# Patient Record
Sex: Female | Born: 1940 | Race: White | Hispanic: No | Marital: Single
Health system: Southern US, Academic
[De-identification: ages and names within clinical notes are randomized; demographics above are authoritative.]

## PROBLEM LIST (undated history)

## (undated) ENCOUNTER — Encounter: Payer: MEDICARE | Attending: Internal Medicine | Primary: Internal Medicine

## (undated) ENCOUNTER — Encounter

## (undated) ENCOUNTER — Telehealth

## (undated) ENCOUNTER — Encounter: Attending: Internal Medicine | Primary: Internal Medicine

## (undated) ENCOUNTER — Encounter: Attending: Geriatric Medicine | Primary: Geriatric Medicine

## (undated) ENCOUNTER — Telehealth: Attending: Internal Medicine | Primary: Internal Medicine

## (undated) ENCOUNTER — Ambulatory Visit: Payer: MEDICARE | Attending: Internal Medicine | Primary: Internal Medicine

## (undated) ENCOUNTER — Encounter: Attending: Pharmacotherapy | Primary: Pharmacotherapy

## (undated) ENCOUNTER — Ambulatory Visit: Payer: MEDICARE

## (undated) ENCOUNTER — Ambulatory Visit: Attending: Pharmacist | Primary: Pharmacist

## (undated) ENCOUNTER — Encounter
Attending: Student in an Organized Health Care Education/Training Program | Primary: Student in an Organized Health Care Education/Training Program

## (undated) ENCOUNTER — Ambulatory Visit

## (undated) ENCOUNTER — Ambulatory Visit
Attending: Rehabilitative and Restorative Service Providers" | Primary: Rehabilitative and Restorative Service Providers"

## (undated) ENCOUNTER — Ambulatory Visit: Attending: Internal Medicine | Primary: Internal Medicine

## (undated) ENCOUNTER — Encounter: Payer: MEDICARE | Attending: Gerontology | Primary: Gerontology

## (undated) ENCOUNTER — Ambulatory Visit: Attending: Clinical | Primary: Clinical

## (undated) ENCOUNTER — Non-Acute Institutional Stay: Payer: MEDICARE | Attending: Internal Medicine | Primary: Internal Medicine

## (undated) DIAGNOSIS — Z8719 Personal history of other diseases of the digestive system: Secondary | ICD-10-CM

## (undated) DIAGNOSIS — K219 Gastro-esophageal reflux disease without esophagitis: Secondary | ICD-10-CM

## (undated) DIAGNOSIS — D649 Anemia, unspecified: Secondary | ICD-10-CM

## (undated) DIAGNOSIS — Z8639 Personal history of other endocrine, nutritional and metabolic disease: Secondary | ICD-10-CM

## (undated) DIAGNOSIS — Z800 Family history of malignant neoplasm of digestive organs: Secondary | ICD-10-CM

## (undated) DIAGNOSIS — F329 Major depressive disorder, single episode, unspecified: Secondary | ICD-10-CM

## (undated) DIAGNOSIS — F419 Anxiety disorder, unspecified: Secondary | ICD-10-CM

## (undated) HISTORY — DX: Personal history of other diseases of the digestive system: Z87.19

## (undated) HISTORY — DX: Family history of malignant neoplasm of digestive organs: Z80.0

## (undated) HISTORY — DX: Anxiety disorder, unspecified: F41.9

## (undated) HISTORY — DX: Anemia, unspecified: D64.9

## (undated) HISTORY — DX: Personal history of other endocrine, nutritional and metabolic disease: Z86.39

## (undated) HISTORY — DX: Gastro-esophageal reflux disease without esophagitis: K21.9

---
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
## 2005-10-11 ENCOUNTER — Encounter: Admission: RE | Admit: 2005-10-11 | Discharge: 2005-10-11 | Payer: Self-pay | Admitting: Gastroenterology

## 2010-12-16 ENCOUNTER — Encounter: Payer: Self-pay | Admitting: Gastroenterology

## 2012-11-03 HISTORY — PX: COLONOSCOPY: SHX174

## 2012-12-15 HISTORY — PX: COLON SURGERY: SHX602

## 2014-12-02 DIAGNOSIS — E782 Mixed hyperlipidemia: Secondary | ICD-10-CM | POA: Diagnosis not present

## 2014-12-02 DIAGNOSIS — E1144 Type 2 diabetes mellitus with diabetic amyotrophy: Secondary | ICD-10-CM | POA: Diagnosis not present

## 2014-12-02 DIAGNOSIS — I129 Hypertensive chronic kidney disease with stage 1 through stage 4 chronic kidney disease, or unspecified chronic kidney disease: Secondary | ICD-10-CM | POA: Diagnosis not present

## 2014-12-09 DIAGNOSIS — N182 Chronic kidney disease, stage 2 (mild): Secondary | ICD-10-CM | POA: Diagnosis not present

## 2014-12-09 DIAGNOSIS — Z6820 Body mass index (BMI) 20.0-20.9, adult: Secondary | ICD-10-CM | POA: Diagnosis not present

## 2014-12-09 DIAGNOSIS — E1144 Type 2 diabetes mellitus with diabetic amyotrophy: Secondary | ICD-10-CM | POA: Diagnosis not present

## 2014-12-09 DIAGNOSIS — I129 Hypertensive chronic kidney disease with stage 1 through stage 4 chronic kidney disease, or unspecified chronic kidney disease: Secondary | ICD-10-CM | POA: Diagnosis not present

## 2015-01-03 DIAGNOSIS — Z1231 Encounter for screening mammogram for malignant neoplasm of breast: Secondary | ICD-10-CM | POA: Diagnosis not present

## 2015-02-24 DIAGNOSIS — Z0000 Encounter for general adult medical examination without abnormal findings: Secondary | ICD-10-CM | POA: Diagnosis not present

## 2015-02-24 DIAGNOSIS — Z139 Encounter for screening, unspecified: Secondary | ICD-10-CM | POA: Diagnosis not present

## 2015-02-24 DIAGNOSIS — Z1389 Encounter for screening for other disorder: Secondary | ICD-10-CM | POA: Diagnosis not present

## 2015-03-31 DIAGNOSIS — E1140 Type 2 diabetes mellitus with diabetic neuropathy, unspecified: Secondary | ICD-10-CM | POA: Diagnosis not present

## 2015-04-07 DIAGNOSIS — I129 Hypertensive chronic kidney disease with stage 1 through stage 4 chronic kidney disease, or unspecified chronic kidney disease: Secondary | ICD-10-CM | POA: Diagnosis not present

## 2015-04-07 DIAGNOSIS — N182 Chronic kidney disease, stage 2 (mild): Secondary | ICD-10-CM | POA: Diagnosis not present

## 2015-04-07 DIAGNOSIS — M810 Age-related osteoporosis without current pathological fracture: Secondary | ICD-10-CM | POA: Diagnosis not present

## 2015-04-07 DIAGNOSIS — E1144 Type 2 diabetes mellitus with diabetic amyotrophy: Secondary | ICD-10-CM | POA: Diagnosis not present

## 2015-04-07 DIAGNOSIS — E782 Mixed hyperlipidemia: Secondary | ICD-10-CM | POA: Diagnosis not present

## 2015-04-14 DIAGNOSIS — M6281 Muscle weakness (generalized): Secondary | ICD-10-CM | POA: Diagnosis not present

## 2015-04-14 DIAGNOSIS — M79641 Pain in right hand: Secondary | ICD-10-CM | POA: Diagnosis not present

## 2015-04-14 DIAGNOSIS — M25641 Stiffness of right hand, not elsewhere classified: Secondary | ICD-10-CM | POA: Diagnosis not present

## 2015-04-14 DIAGNOSIS — M150 Primary generalized (osteo)arthritis: Secondary | ICD-10-CM | POA: Diagnosis not present

## 2015-07-27 DIAGNOSIS — F329 Major depressive disorder, single episode, unspecified: Secondary | ICD-10-CM | POA: Diagnosis not present

## 2015-07-28 DIAGNOSIS — R413 Other amnesia: Secondary | ICD-10-CM | POA: Diagnosis not present

## 2015-08-04 DIAGNOSIS — E782 Mixed hyperlipidemia: Secondary | ICD-10-CM | POA: Diagnosis not present

## 2015-08-04 DIAGNOSIS — E1144 Type 2 diabetes mellitus with diabetic amyotrophy: Secondary | ICD-10-CM | POA: Diagnosis not present

## 2015-08-09 DIAGNOSIS — E782 Mixed hyperlipidemia: Secondary | ICD-10-CM | POA: Diagnosis not present

## 2015-08-09 DIAGNOSIS — N182 Chronic kidney disease, stage 2 (mild): Secondary | ICD-10-CM | POA: Diagnosis not present

## 2015-08-09 DIAGNOSIS — I129 Hypertensive chronic kidney disease with stage 1 through stage 4 chronic kidney disease, or unspecified chronic kidney disease: Secondary | ICD-10-CM | POA: Diagnosis not present

## 2015-08-09 DIAGNOSIS — E1169 Type 2 diabetes mellitus with other specified complication: Secondary | ICD-10-CM | POA: Diagnosis not present

## 2015-09-27 DIAGNOSIS — H25013 Cortical age-related cataract, bilateral: Secondary | ICD-10-CM | POA: Diagnosis not present

## 2015-09-27 DIAGNOSIS — H25043 Posterior subcapsular polar age-related cataract, bilateral: Secondary | ICD-10-CM | POA: Diagnosis not present

## 2015-09-27 DIAGNOSIS — H2512 Age-related nuclear cataract, left eye: Secondary | ICD-10-CM | POA: Diagnosis not present

## 2015-09-27 DIAGNOSIS — H2513 Age-related nuclear cataract, bilateral: Secondary | ICD-10-CM | POA: Diagnosis not present

## 2015-09-27 DIAGNOSIS — H18413 Arcus senilis, bilateral: Secondary | ICD-10-CM | POA: Diagnosis not present

## 2015-11-03 DIAGNOSIS — L219 Seborrheic dermatitis, unspecified: Secondary | ICD-10-CM | POA: Diagnosis not present

## 2015-11-10 DIAGNOSIS — Z139 Encounter for screening, unspecified: Secondary | ICD-10-CM | POA: Diagnosis not present

## 2015-11-10 DIAGNOSIS — Z1389 Encounter for screening for other disorder: Secondary | ICD-10-CM | POA: Diagnosis not present

## 2015-11-10 DIAGNOSIS — E782 Mixed hyperlipidemia: Secondary | ICD-10-CM | POA: Diagnosis not present

## 2015-11-10 DIAGNOSIS — M50322 Other cervical disc degeneration at C5-C6 level: Secondary | ICD-10-CM | POA: Diagnosis not present

## 2015-11-10 DIAGNOSIS — F332 Major depressive disorder, recurrent severe without psychotic features: Secondary | ICD-10-CM | POA: Diagnosis not present

## 2015-11-10 DIAGNOSIS — E1169 Type 2 diabetes mellitus with other specified complication: Secondary | ICD-10-CM | POA: Diagnosis not present

## 2015-11-14 DIAGNOSIS — H25812 Combined forms of age-related cataract, left eye: Secondary | ICD-10-CM | POA: Diagnosis not present

## 2015-11-14 DIAGNOSIS — H2512 Age-related nuclear cataract, left eye: Secondary | ICD-10-CM | POA: Diagnosis not present

## 2015-11-15 DIAGNOSIS — H2511 Age-related nuclear cataract, right eye: Secondary | ICD-10-CM | POA: Diagnosis not present

## 2015-12-02 DIAGNOSIS — H2511 Age-related nuclear cataract, right eye: Secondary | ICD-10-CM | POA: Diagnosis not present

## 2015-12-02 DIAGNOSIS — H25811 Combined forms of age-related cataract, right eye: Secondary | ICD-10-CM | POA: Diagnosis not present

## 2015-12-20 DIAGNOSIS — E1144 Type 2 diabetes mellitus with diabetic amyotrophy: Secondary | ICD-10-CM | POA: Diagnosis not present

## 2015-12-20 DIAGNOSIS — E782 Mixed hyperlipidemia: Secondary | ICD-10-CM | POA: Diagnosis not present

## 2015-12-27 DIAGNOSIS — H401131 Primary open-angle glaucoma, bilateral, mild stage: Secondary | ICD-10-CM | POA: Diagnosis not present

## 2015-12-27 DIAGNOSIS — I129 Hypertensive chronic kidney disease with stage 1 through stage 4 chronic kidney disease, or unspecified chronic kidney disease: Secondary | ICD-10-CM | POA: Diagnosis not present

## 2015-12-27 DIAGNOSIS — E782 Mixed hyperlipidemia: Secondary | ICD-10-CM | POA: Diagnosis not present

## 2015-12-27 DIAGNOSIS — E1169 Type 2 diabetes mellitus with other specified complication: Secondary | ICD-10-CM | POA: Diagnosis not present

## 2015-12-28 DIAGNOSIS — R040 Epistaxis: Secondary | ICD-10-CM | POA: Diagnosis not present

## 2016-01-06 DIAGNOSIS — Z1231 Encounter for screening mammogram for malignant neoplasm of breast: Secondary | ICD-10-CM | POA: Diagnosis not present

## 2016-01-24 DIAGNOSIS — H401131 Primary open-angle glaucoma, bilateral, mild stage: Secondary | ICD-10-CM | POA: Diagnosis not present

## 2016-01-31 DIAGNOSIS — Z139 Encounter for screening, unspecified: Secondary | ICD-10-CM | POA: Diagnosis not present

## 2016-01-31 DIAGNOSIS — Z0000 Encounter for general adult medical examination without abnormal findings: Secondary | ICD-10-CM | POA: Diagnosis not present

## 2016-01-31 DIAGNOSIS — F332 Major depressive disorder, recurrent severe without psychotic features: Secondary | ICD-10-CM | POA: Diagnosis not present

## 2016-02-07 DIAGNOSIS — R634 Abnormal weight loss: Secondary | ICD-10-CM | POA: Diagnosis not present

## 2016-02-07 DIAGNOSIS — M8580 Other specified disorders of bone density and structure, unspecified site: Secondary | ICD-10-CM | POA: Diagnosis not present

## 2016-02-07 DIAGNOSIS — F410 Panic disorder [episodic paroxysmal anxiety] without agoraphobia: Secondary | ICD-10-CM | POA: Diagnosis not present

## 2016-02-07 DIAGNOSIS — R636 Underweight: Secondary | ICD-10-CM | POA: Diagnosis not present

## 2016-02-17 DIAGNOSIS — M8588 Other specified disorders of bone density and structure, other site: Secondary | ICD-10-CM | POA: Diagnosis not present

## 2016-02-17 DIAGNOSIS — M8589 Other specified disorders of bone density and structure, multiple sites: Secondary | ICD-10-CM | POA: Diagnosis not present

## 2016-02-23 DIAGNOSIS — M810 Age-related osteoporosis without current pathological fracture: Secondary | ICD-10-CM | POA: Diagnosis not present

## 2016-02-23 DIAGNOSIS — D7589 Other specified diseases of blood and blood-forming organs: Secondary | ICD-10-CM | POA: Diagnosis not present

## 2016-03-22 DIAGNOSIS — N182 Chronic kidney disease, stage 2 (mild): Secondary | ICD-10-CM | POA: Diagnosis not present

## 2016-03-22 DIAGNOSIS — I129 Hypertensive chronic kidney disease with stage 1 through stage 4 chronic kidney disease, or unspecified chronic kidney disease: Secondary | ICD-10-CM | POA: Diagnosis not present

## 2016-03-22 DIAGNOSIS — E1140 Type 2 diabetes mellitus with diabetic neuropathy, unspecified: Secondary | ICD-10-CM | POA: Diagnosis not present

## 2016-03-23 DIAGNOSIS — E538 Deficiency of other specified B group vitamins: Secondary | ICD-10-CM | POA: Diagnosis not present

## 2016-03-27 DIAGNOSIS — E538 Deficiency of other specified B group vitamins: Secondary | ICD-10-CM | POA: Diagnosis not present

## 2016-04-03 DIAGNOSIS — E538 Deficiency of other specified B group vitamins: Secondary | ICD-10-CM | POA: Diagnosis not present

## 2016-04-10 DIAGNOSIS — E538 Deficiency of other specified B group vitamins: Secondary | ICD-10-CM | POA: Diagnosis not present

## 2016-04-17 DIAGNOSIS — E538 Deficiency of other specified B group vitamins: Secondary | ICD-10-CM | POA: Diagnosis not present

## 2016-04-19 DIAGNOSIS — M810 Age-related osteoporosis without current pathological fracture: Secondary | ICD-10-CM | POA: Diagnosis not present

## 2016-04-19 DIAGNOSIS — I129 Hypertensive chronic kidney disease with stage 1 through stage 4 chronic kidney disease, or unspecified chronic kidney disease: Secondary | ICD-10-CM | POA: Diagnosis not present

## 2016-04-19 DIAGNOSIS — N182 Chronic kidney disease, stage 2 (mild): Secondary | ICD-10-CM | POA: Diagnosis not present

## 2016-04-19 DIAGNOSIS — E538 Deficiency of other specified B group vitamins: Secondary | ICD-10-CM | POA: Diagnosis not present

## 2016-04-24 DIAGNOSIS — M810 Age-related osteoporosis without current pathological fracture: Secondary | ICD-10-CM | POA: Diagnosis not present

## 2016-04-24 DIAGNOSIS — E538 Deficiency of other specified B group vitamins: Secondary | ICD-10-CM | POA: Diagnosis not present

## 2016-04-25 DIAGNOSIS — M810 Age-related osteoporosis without current pathological fracture: Secondary | ICD-10-CM | POA: Diagnosis not present

## 2016-05-24 DIAGNOSIS — E538 Deficiency of other specified B group vitamins: Secondary | ICD-10-CM | POA: Diagnosis not present

## 2016-06-26 DIAGNOSIS — E538 Deficiency of other specified B group vitamins: Secondary | ICD-10-CM | POA: Diagnosis not present

## 2016-07-12 DIAGNOSIS — I10 Essential (primary) hypertension: Secondary | ICD-10-CM | POA: Diagnosis not present

## 2016-07-12 DIAGNOSIS — M818 Other osteoporosis without current pathological fracture: Secondary | ICD-10-CM | POA: Diagnosis not present

## 2016-07-12 DIAGNOSIS — E785 Hyperlipidemia, unspecified: Secondary | ICD-10-CM | POA: Diagnosis not present

## 2016-07-12 DIAGNOSIS — E119 Type 2 diabetes mellitus without complications: Secondary | ICD-10-CM | POA: Diagnosis not present

## 2016-07-24 DIAGNOSIS — H04123 Dry eye syndrome of bilateral lacrimal glands: Secondary | ICD-10-CM | POA: Diagnosis not present

## 2016-07-24 DIAGNOSIS — E119 Type 2 diabetes mellitus without complications: Secondary | ICD-10-CM | POA: Diagnosis not present

## 2016-07-24 DIAGNOSIS — I10 Essential (primary) hypertension: Secondary | ICD-10-CM | POA: Diagnosis not present

## 2016-07-24 DIAGNOSIS — H401131 Primary open-angle glaucoma, bilateral, mild stage: Secondary | ICD-10-CM | POA: Diagnosis not present

## 2016-07-24 DIAGNOSIS — Z961 Presence of intraocular lens: Secondary | ICD-10-CM | POA: Diagnosis not present

## 2016-08-09 DIAGNOSIS — Z09 Encounter for follow-up examination after completed treatment for conditions other than malignant neoplasm: Secondary | ICD-10-CM | POA: Diagnosis not present

## 2016-08-09 DIAGNOSIS — M818 Other osteoporosis without current pathological fracture: Secondary | ICD-10-CM | POA: Diagnosis not present

## 2016-08-09 DIAGNOSIS — Z79899 Other long term (current) drug therapy: Secondary | ICD-10-CM | POA: Diagnosis not present

## 2016-08-09 DIAGNOSIS — Z7982 Long term (current) use of aspirin: Secondary | ICD-10-CM | POA: Diagnosis not present

## 2016-08-09 DIAGNOSIS — M810 Age-related osteoporosis without current pathological fracture: Secondary | ICD-10-CM | POA: Diagnosis not present

## 2016-08-09 DIAGNOSIS — E785 Hyperlipidemia, unspecified: Secondary | ICD-10-CM | POA: Diagnosis not present

## 2016-10-11 DIAGNOSIS — E785 Hyperlipidemia, unspecified: Secondary | ICD-10-CM | POA: Diagnosis not present

## 2016-10-11 DIAGNOSIS — Z7982 Long term (current) use of aspirin: Secondary | ICD-10-CM | POA: Diagnosis not present

## 2016-10-11 DIAGNOSIS — I10 Essential (primary) hypertension: Secondary | ICD-10-CM | POA: Diagnosis not present

## 2016-10-11 DIAGNOSIS — M810 Age-related osteoporosis without current pathological fracture: Secondary | ICD-10-CM | POA: Diagnosis not present

## 2016-10-11 DIAGNOSIS — Z79899 Other long term (current) drug therapy: Secondary | ICD-10-CM | POA: Diagnosis not present

## 2016-10-11 DIAGNOSIS — M818 Other osteoporosis without current pathological fracture: Secondary | ICD-10-CM | POA: Diagnosis not present

## 2016-12-27 DIAGNOSIS — I10 Essential (primary) hypertension: Secondary | ICD-10-CM | POA: Diagnosis not present

## 2016-12-27 DIAGNOSIS — M818 Other osteoporosis without current pathological fracture: Secondary | ICD-10-CM | POA: Diagnosis not present

## 2016-12-27 DIAGNOSIS — Z7982 Long term (current) use of aspirin: Secondary | ICD-10-CM | POA: Diagnosis not present

## 2016-12-27 DIAGNOSIS — M810 Age-related osteoporosis without current pathological fracture: Secondary | ICD-10-CM | POA: Diagnosis not present

## 2016-12-27 DIAGNOSIS — Z23 Encounter for immunization: Secondary | ICD-10-CM | POA: Diagnosis not present

## 2017-03-07 DIAGNOSIS — Z789 Other specified health status: Secondary | ICD-10-CM | POA: Diagnosis not present

## 2017-03-07 DIAGNOSIS — M818 Other osteoporosis without current pathological fracture: Secondary | ICD-10-CM | POA: Diagnosis not present

## 2017-03-07 DIAGNOSIS — Z7982 Long term (current) use of aspirin: Secondary | ICD-10-CM | POA: Diagnosis not present

## 2017-03-07 DIAGNOSIS — Z79899 Other long term (current) drug therapy: Secondary | ICD-10-CM | POA: Diagnosis not present

## 2017-03-07 DIAGNOSIS — I10 Essential (primary) hypertension: Secondary | ICD-10-CM | POA: Diagnosis not present

## 2017-03-07 DIAGNOSIS — Z7289 Other problems related to lifestyle: Secondary | ICD-10-CM | POA: Diagnosis not present

## 2017-03-07 DIAGNOSIS — M810 Age-related osteoporosis without current pathological fracture: Secondary | ICD-10-CM | POA: Diagnosis not present

## 2017-03-19 DIAGNOSIS — I10 Essential (primary) hypertension: Secondary | ICD-10-CM | POA: Diagnosis not present

## 2017-03-19 DIAGNOSIS — H04123 Dry eye syndrome of bilateral lacrimal glands: Secondary | ICD-10-CM | POA: Diagnosis not present

## 2017-03-19 DIAGNOSIS — H02839 Dermatochalasis of unspecified eye, unspecified eyelid: Secondary | ICD-10-CM | POA: Diagnosis not present

## 2017-03-19 DIAGNOSIS — H401131 Primary open-angle glaucoma, bilateral, mild stage: Secondary | ICD-10-CM | POA: Diagnosis not present

## 2017-03-19 DIAGNOSIS — E119 Type 2 diabetes mellitus without complications: Secondary | ICD-10-CM | POA: Diagnosis not present

## 2017-05-27 MED ORDER — MIRTAZAPINE 15 MG TABLET: 45 mg | tablet | Freq: Every evening | 3 refills | 0 days | Status: AC

## 2017-05-27 MED ORDER — MIRTAZAPINE 15 MG TABLET
ORAL_TABLET | Freq: Every evening | ORAL | 2 refills | 0.00000 days | Status: CP
Start: 2017-05-27 — End: 2017-05-27

## 2017-07-10 ENCOUNTER — Ambulatory Visit
Admission: RE | Admit: 2017-07-10 | Discharge: 2017-07-10 | Disposition: A | Discharge status: Home / Self Care | Payer: MEDICARE | Admitting: Internal Medicine

## 2017-07-10 DIAGNOSIS — Z0000 Encounter for general adult medical examination without abnormal findings: Secondary | ICD-10-CM | POA: Diagnosis not present

## 2017-07-10 DIAGNOSIS — Z7182 Exercise counseling: Secondary | ICD-10-CM | POA: Diagnosis not present

## 2017-07-10 DIAGNOSIS — Z7982 Long term (current) use of aspirin: Secondary | ICD-10-CM | POA: Diagnosis not present

## 2017-07-10 DIAGNOSIS — I10 Essential (primary) hypertension: Secondary | ICD-10-CM | POA: Diagnosis not present

## 2017-07-10 DIAGNOSIS — M818 Other osteoporosis without current pathological fracture: Secondary | ICD-10-CM | POA: Diagnosis not present

## 2017-07-10 DIAGNOSIS — Z789 Other specified health status: Secondary | ICD-10-CM | POA: Diagnosis not present

## 2017-07-10 DIAGNOSIS — F329 Major depressive disorder, single episode, unspecified: Secondary | ICD-10-CM

## 2017-07-10 DIAGNOSIS — F0390 Unspecified dementia without behavioral disturbance: Secondary | ICD-10-CM

## 2017-07-10 DIAGNOSIS — F419 Anxiety disorder, unspecified: Secondary | ICD-10-CM

## 2017-07-10 DIAGNOSIS — Z7289 Other problems related to lifestyle: Secondary | ICD-10-CM

## 2017-08-23 MED ORDER — CITALOPRAM 20 MG TABLET
ORAL_TABLET | Freq: Every day | ORAL | 3 refills | 0 days | Status: CP
Start: 2017-08-23 — End: 2017-08-28

## 2017-08-28 MED ORDER — CITALOPRAM 20 MG TABLET
ORAL_TABLET | Freq: Every day | ORAL | 3 refills | 0 days | Status: CP
Start: 2017-08-28 — End: 2018-05-05

## 2017-10-01 DIAGNOSIS — H401131 Primary open-angle glaucoma, bilateral, mild stage: Secondary | ICD-10-CM | POA: Diagnosis not present

## 2017-10-01 DIAGNOSIS — I10 Essential (primary) hypertension: Secondary | ICD-10-CM | POA: Diagnosis not present

## 2017-10-01 DIAGNOSIS — H04123 Dry eye syndrome of bilateral lacrimal glands: Secondary | ICD-10-CM | POA: Diagnosis not present

## 2017-10-01 DIAGNOSIS — H02839 Dermatochalasis of unspecified eye, unspecified eyelid: Secondary | ICD-10-CM | POA: Diagnosis not present

## 2017-10-04 DIAGNOSIS — J209 Acute bronchitis, unspecified: Secondary | ICD-10-CM | POA: Diagnosis not present

## 2017-10-04 DIAGNOSIS — R05 Cough: Secondary | ICD-10-CM | POA: Diagnosis not present

## 2017-10-04 DIAGNOSIS — J040 Acute laryngitis: Secondary | ICD-10-CM | POA: Diagnosis not present

## 2017-10-15 MED ORDER — MIRTAZAPINE 15 MG TABLET
ORAL_TABLET | Freq: Every evening | ORAL | 3 refills | 0 days | Status: CP
Start: 2017-10-15 — End: 2018-09-22

## 2018-01-15 ENCOUNTER — Encounter: Admit: 2018-01-15 | Discharge: 2018-01-16 | Payer: MEDICARE

## 2018-01-15 DIAGNOSIS — Z0000 Encounter for general adult medical examination without abnormal findings: Secondary | ICD-10-CM | POA: Diagnosis not present

## 2018-01-15 DIAGNOSIS — E785 Hyperlipidemia, unspecified: Secondary | ICD-10-CM | POA: Diagnosis not present

## 2018-01-15 DIAGNOSIS — M818 Other osteoporosis without current pathological fracture: Secondary | ICD-10-CM | POA: Diagnosis not present

## 2018-01-15 DIAGNOSIS — I10 Essential (primary) hypertension: Secondary | ICD-10-CM | POA: Diagnosis not present

## 2018-01-15 DIAGNOSIS — F0390 Unspecified dementia without behavioral disturbance: Secondary | ICD-10-CM

## 2018-01-15 DIAGNOSIS — F329 Major depressive disorder, single episode, unspecified: Secondary | ICD-10-CM

## 2018-01-15 DIAGNOSIS — F419 Anxiety disorder, unspecified: Secondary | ICD-10-CM

## 2018-03-21 MED ORDER — LOSARTAN 50 MG TABLET
ORAL_TABLET | Freq: Every day | ORAL | 0 refills | 0 days | Status: CP
Start: 2018-03-21 — End: 2018-06-23

## 2018-04-01 DIAGNOSIS — H401131 Primary open-angle glaucoma, bilateral, mild stage: Secondary | ICD-10-CM | POA: Diagnosis not present

## 2018-04-01 DIAGNOSIS — H04123 Dry eye syndrome of bilateral lacrimal glands: Secondary | ICD-10-CM | POA: Diagnosis not present

## 2018-04-01 DIAGNOSIS — H02839 Dermatochalasis of unspecified eye, unspecified eyelid: Secondary | ICD-10-CM | POA: Diagnosis not present

## 2018-04-23 ENCOUNTER — Ambulatory Visit: Admit: 2018-04-23 | Discharge: 2018-04-24 | Payer: MEDICARE

## 2018-04-23 DIAGNOSIS — I10 Essential (primary) hypertension: Secondary | ICD-10-CM | POA: Diagnosis not present

## 2018-04-23 DIAGNOSIS — M818 Other osteoporosis without current pathological fracture: Secondary | ICD-10-CM | POA: Diagnosis not present

## 2018-04-23 DIAGNOSIS — Z0000 Encounter for general adult medical examination without abnormal findings: Secondary | ICD-10-CM | POA: Diagnosis not present

## 2018-04-23 DIAGNOSIS — F419 Anxiety disorder, unspecified: Secondary | ICD-10-CM

## 2018-04-23 DIAGNOSIS — F0390 Unspecified dementia without behavioral disturbance: Principal | ICD-10-CM

## 2018-04-23 DIAGNOSIS — F329 Major depressive disorder, single episode, unspecified: Secondary | ICD-10-CM

## 2018-05-02 DIAGNOSIS — J00 Acute nasopharyngitis [common cold]: Secondary | ICD-10-CM | POA: Diagnosis not present

## 2018-05-07 MED ORDER — CITALOPRAM 20 MG TABLET
ORAL_TABLET | Freq: Every day | ORAL | 3 refills | 0.00000 days | Status: CP
Start: 2018-05-07 — End: 2018-09-08

## 2018-06-23 MED ORDER — LOSARTAN 50 MG TABLET
ORAL_TABLET | Freq: Every day | ORAL | 3 refills | 0 days | Status: CP
Start: 2018-06-23 — End: 2019-07-29

## 2018-07-24 ENCOUNTER — Encounter
Admit: 2018-07-24 | Discharge: 2018-07-25 | Payer: MEDICARE | Attending: Student in an Organized Health Care Education/Training Program | Primary: Student in an Organized Health Care Education/Training Program

## 2018-07-24 DIAGNOSIS — M818 Other osteoporosis without current pathological fracture: Secondary | ICD-10-CM | POA: Diagnosis not present

## 2018-07-24 DIAGNOSIS — M810 Age-related osteoporosis without current pathological fracture: Secondary | ICD-10-CM | POA: Diagnosis not present

## 2018-07-24 DIAGNOSIS — I10 Essential (primary) hypertension: Secondary | ICD-10-CM | POA: Diagnosis not present

## 2018-07-24 DIAGNOSIS — Z79899 Other long term (current) drug therapy: Secondary | ICD-10-CM | POA: Diagnosis not present

## 2018-07-24 DIAGNOSIS — M8580 Other specified disorders of bone density and structure, unspecified site: Secondary | ICD-10-CM | POA: Diagnosis not present

## 2018-08-12 DIAGNOSIS — Z961 Presence of intraocular lens: Secondary | ICD-10-CM | POA: Diagnosis not present

## 2018-08-12 DIAGNOSIS — H26491 Other secondary cataract, right eye: Secondary | ICD-10-CM | POA: Diagnosis not present

## 2018-08-12 DIAGNOSIS — H04123 Dry eye syndrome of bilateral lacrimal glands: Secondary | ICD-10-CM | POA: Diagnosis not present

## 2018-08-12 DIAGNOSIS — H401131 Primary open-angle glaucoma, bilateral, mild stage: Secondary | ICD-10-CM | POA: Diagnosis not present

## 2018-09-09 MED ORDER — CITALOPRAM 20 MG TABLET
ORAL_TABLET | Freq: Every day | ORAL | 3 refills | 0.00000 days | Status: CP
Start: 2018-09-09 — End: 2019-01-20

## 2018-09-09 MED ORDER — CITALOPRAM 20 MG TABLET: 20 mg | tablet | Freq: Every day | 3 refills | 0 days | Status: AC

## 2018-09-11 ENCOUNTER — Other Ambulatory Visit (INDEPENDENT_AMBULATORY_CARE_PROVIDER_SITE_OTHER): Payer: Self-pay | Admitting: Ophthalmology

## 2018-09-11 MED ORDER — LATANOPROST 0.005 % OP SOLN: 1.0000 [drp] | Freq: Every day | OPHTHALMIC | 0 refills | Status: AC

## 2018-09-11 NOTE — Telephone Encounter (Signed)
Dr. Vonna Kotyk pt who called ophthalmologist on call to request urgent refill of latanoprost to [facility] in [place]. Drop prescription e-scribed per pt request.  Karie Chimera, M.D., Ph.D. Diseases & Surgery of the Retina and Vitreous [facility]

## 2018-09-24 ENCOUNTER — Ambulatory Visit (INDEPENDENT_AMBULATORY_CARE_PROVIDER_SITE_OTHER): Payer: Medicare Other

## 2018-09-24 ENCOUNTER — Encounter: Payer: Self-pay | Admitting: Sports Medicine

## 2018-09-24 ENCOUNTER — Other Ambulatory Visit: Payer: Self-pay

## 2018-09-24 ENCOUNTER — Ambulatory Visit: Payer: Medicare Other | Admitting: Sports Medicine

## 2018-09-24 ENCOUNTER — Other Ambulatory Visit: Payer: Self-pay | Admitting: Sports Medicine

## 2018-09-24 VITALS — BP 111/49 | HR 79 | Resp 16 | Ht 63.0 in | Wt 115.0 lb

## 2018-09-24 DIAGNOSIS — T148XXA Other injury of unspecified body region, initial encounter: Secondary | ICD-10-CM | POA: Diagnosis not present

## 2018-09-24 DIAGNOSIS — M21611 Bunion of right foot: Secondary | ICD-10-CM | POA: Diagnosis not present

## 2018-09-24 DIAGNOSIS — M79671 Pain in right foot: Secondary | ICD-10-CM | POA: Diagnosis not present

## 2018-09-24 DIAGNOSIS — M779 Enthesopathy, unspecified: Secondary | ICD-10-CM

## 2018-09-24 DIAGNOSIS — M898X9 Other specified disorders of bone, unspecified site: Secondary | ICD-10-CM | POA: Diagnosis not present

## 2018-09-24 MED ORDER — MIRTAZAPINE 15 MG TABLET
ORAL_TABLET | Freq: Every evening | ORAL | 3 refills | 0.00000 days | Status: CP
Start: 2018-09-24 — End: 2019-07-28

## 2018-09-24 NOTE — Progress Notes (Signed)
   Subjective:    Patient ID: Janice Jones, female    DOB: 09/26/1941, 77 y.o.   MRN: [id number]  HPI    Review of Systems  Musculoskeletal: Positive for arthralgias and myalgias.  All other systems reviewed and are negative.      Objective:   Physical Exam        Assessment & Plan:

## 2018-09-24 NOTE — Patient Instructions (Signed)
Shower protector may be helpful to buy to keep your foot from getting wet after you have foot surgery

## 2018-09-24 NOTE — Progress Notes (Signed)
Subjective: Janice Jones is a 77 y.o. female patient who presents to office for evaluation of right foot pain. Patient complains of progressive pain especially over the last few years over the right big toe joint ranks pain 10/10 and is now interferring with daily activities and has become constant especially with wearing shoes states that she has not tried any treatment and has noticed that a small spot keeps coming up over the side of the right great toe that is concerning.  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  Patient denies nausea, vomiting, fever, chills or any other constitutional symptoms at this time. Patient has tried changing shoes with no relief in symptoms. Patient denies any other pedal complaints. Denies injury/trip/fall/sprain/any causative factors.   Review of Systems  Musculoskeletal: Positive for joint pain and myalgias.  All other systems reviewed and are negative.    There are no active problems to display for this patient.   Current Outpatient Medications on File Prior to Visit  Medication Sig Dispense Refill  . acetaminophen (TYLENOL) 500 MG tablet Take 500 mg by mouth every 6 (six) hours as needed.    . calcium carbonate (OSCAL) 1500 (600 Ca) MG TABS tablet Take by mouth 2 (two) times daily with a meal.    . latanoprost (XALATAN) 0.005 % ophthalmic solution Place 1 drop into both eyes daily. 2.5 mL 0  . Melatonin 3-10 MG TABS Take by mouth.    . mirtazapine (REMERON) 15 MG tablet Take 15 mg by mouth at bedtime.     No current facility-administered medications on file prior to visit.     Not on File  Objective:  General: Alert and oriented x3 in no acute distress  Dermatology: Scabbed over abrasion at the medial first metatarsophalangeal joint on the right foot with no surrounding signs of infection, no open lesions bilateral lower extremities, no  webspace macerations, no ecchymosis bilateral, all nails x 10 are well manicured.  Vascular: Dorsalis Pedis and Posterior Tibial pedal pulses palpable 1 out of 4, Capillary Fill Time 5 seconds, scant pedal hair growth bilateral, no edema bilateral lower extremities, Temperature gradient within normal limits.  Neurology: Michaell Cowing sensation intact via light touch bilateral.  Musculoskeletal: Mild tenderness with palpation at bunion right greater than left.  Pes planus foot type.  No other acute findings noted.  Gait: Antalgic gait  Xrays  Right foot   Impression: Hardware intact from previous bunion surgery on right there is mild enlargement of prominence of the first metatarsal head with signs of arthritis noted at this joint, no other acute findings.  Assessment and Plan: Problem List Items Addressed This Visit    None    Visit Diagnoses    Bunion of great toe of right foot    -  Primary   Right foot pain       Relevant Orders   DG Foot Complete Right   Capsulitis       Bony exostosis       Abrasion          -Complete examination performed -Xrays reviewed -Discussed treatement options for exostosis and arthritis with residual bunion formation and abrasion from the bunion rubbing when in shoes -Rx tube foam padding to wear when in shoes to protect the bump and advised patient to apply antibiotic cream and Band-Aid to the abrasion to prevent infection of  the skin -Advised patient that this will continue to rub and be a problem if not addressed surgically thus recommend consideration of exostectomy at the medial eminence of the bunion on the right foot patient to return to office in 2 weeks for surgery consult or sooner if problems or issues arise.  Asencion Islam, DPM

## 2018-10-08 ENCOUNTER — Encounter: Payer: Self-pay | Admitting: Sports Medicine

## 2018-10-08 ENCOUNTER — Ambulatory Visit: Payer: Medicare Other | Admitting: Sports Medicine

## 2018-10-08 DIAGNOSIS — T148XXA Other injury of unspecified body region, initial encounter: Secondary | ICD-10-CM | POA: Diagnosis not present

## 2018-10-08 DIAGNOSIS — M898X9 Other specified disorders of bone, unspecified site: Secondary | ICD-10-CM | POA: Diagnosis not present

## 2018-10-08 DIAGNOSIS — M2021 Hallux rigidus, right foot: Secondary | ICD-10-CM

## 2018-10-08 DIAGNOSIS — M21611 Bunion of right foot: Secondary | ICD-10-CM

## 2018-10-08 DIAGNOSIS — M79671 Pain in right foot: Secondary | ICD-10-CM

## 2018-10-08 NOTE — Progress Notes (Addendum)
Subjective: Janice Jones is a 77 y.o. female patient who presents to office for evaluation of right foot pain and abrasion over bunion site.  Patient states that the pain is doing about the same states that she tried to wear the foam over the toe but it seems like it broke more pressure so she returned to using a protective Band-Aid and antibiotic cream.  Patient denies warmth redness drainage or any acute local signs of infection.  Patient is assisted today's visit by son.   There are no active problems to display for this patient.   Current Outpatient Medications on File Prior to Visit  Medication Sig Dispense Refill  . acetaminophen (TYLENOL) 500 MG tablet Take 500 mg by mouth every 6 (six) hours as needed.    . calcium carbonate (OSCAL) 1500 (600 Ca) MG TABS tablet Take by mouth 2 (two) times daily with a meal.    . citalopram (CELEXA) 20 MG tablet     . latanoprost (XALATAN) 0.005 % ophthalmic solution Place 1 drop into both eyes daily. 2.5 mL 0  . losartan (COZAAR) 50 MG tablet     . Melatonin 3-10 MG TABS Take by mouth.    . mirtazapine (REMERON) 15 MG tablet Take 15 mg by mouth at bedtime.     No current facility-administered medications on file prior to visit.     Not on File   Social History   Socioeconomic History  . Marital status: Widowed    Spouse name: Not on file  . Number of children: Not on file  . Years of education: Not on file  . Highest education level: Not on file  Occupational History  . Not on file  Social Needs  . Financial resource strain: Not on file  . Food insecurity:    Worry: Not on file    Inability: Not on file  . Transportation needs:    Medical: Not on file    Non-medical: Not on file  Tobacco Use  . Smoking status: Never Smoker  . Smokeless tobacco: Never Used  Substance and Sexual Activity  . Alcohol use: Not on file  . Drug use: Not on file  . Sexual activity: Not on file  Lifestyle  . Physical activity:    Days per week: Not  on file    Minutes per session: Not on file  . Stress: Not on file  Relationships  . Social connections:    Talks on phone: Not on file    Gets together: Not on file    Attends religious service: Not on file    Active member of club or organization: Not on file    Attends meetings of clubs or organizations: Not on file    Relationship status: Not on file  Other Topics Concern  . Not on file  Social History Narrative  . Not on file    History reviewed. No pertinent surgical history.  Objective:  General: Alert and oriented x3 in no acute distress  Dermatology: Scabbed over abrasion at the medial first metatarsophalangeal joint on the right foot with no surrounding signs of infection, no open lesions bilateral lower extremities, no webspace macerations, no ecchymosis bilateral, all nails x 10 are well manicured.  Vascular: Dorsalis Pedis and Posterior Tibial pedal pulses palpable 1 out of 4, Capillary Fill Time 5 seconds, scant pedal hair growth bilateral, no edema bilateral lower extremities, Temperature gradient within normal limits.  Neurology: Gross sensation intact via light touch bilateral.  Musculoskeletal:  Mild tenderness with palpation at bunion right greater than left.  Pes planus foot type.  No other acute findings noted.  Assessment and Plan: Problem List Items Addressed This Visit    None    Visit Diagnoses    Right foot pain    -  Primary   Bony exostosis       Bunion of great toe of right foot       Abrasion       Hallux rigidus of right foot          -Complete examination performed -Discussed with patient treatment options for exostosis at bunion with abrasion to skin right foot with possible concern that without surgery the abrasion can worsen or become a wound over the bunion site -Patient opt for surgical management. Consent obtained for silver bunionectomy for removal of bony prominence at bunion site. Pre and Post op course explained. Risks, benefits,  alternatives explained. No guarantees given or implied. Surgical booking slip submitted and provided patient with Surgical packet and info for [facility].  Patient made aware to go to get her history and physical completed first and after that is done to call office or date for surgery. -Dispensed surgical shoe to use post op -All questions answered -Patient to return to office after surgery or sooner if problems or issues arise  Asencion Islamitorya Golda Zavalza, DPM

## 2018-10-08 NOTE — Patient Instructions (Signed)
Pre-Operative Instructions  Congratulations, you have decided to take an important step towards improving your quality of life.  You can be assured that the doctors and staff at [facility] will be with you every step of the way.  Here are some important things you should know:  1. Plan to be at the surgery center/hospital at least 1 (one) hour prior to your scheduled time, unless otherwise directed by the surgical center/hospital staff.  You must have a responsible adult accompany you, remain during the surgery and drive you home.  Make sure you have directions to the surgical center/hospital to ensure you arrive on time. 2. If you are having surgery at [facility] or [facility] hospitals, you will need a copy of your medical history and physical form from your family physician within one month prior to the date of surgery. We will give you a form for your primary physician to complete.  3. We make every effort to accommodate the date you request for surgery.  However, there are times where surgery dates or times have to be moved.  We will contact you as soon as possible if a change in schedule is required.   4. No aspirin/ibuprofen for one week before surgery.  If you are on aspirin, any non-steroidal anti-inflammatory medications (Mobic, Aleve, Ibuprofen) should not be taken seven (7) days prior to your surgery.  You make take Tylenol for pain prior to surgery.  5. Medications - If you are taking daily heart and blood pressure medications, seizure, reflux, allergy, asthma, anxiety, pain or diabetes medications, make sure you notify the surgery center/hospital before the day of surgery so they can tell you which medications you should take or avoid the day of surgery. 6. No food or drink after midnight the night before surgery unless directed otherwise by surgical center/hospital staff. 7. No alcoholic beverages 24-hours prior to surgery.  No smoking 24-hours prior or 24-hours after  surgery. 8. Wear loose pants or shorts. They should be loose enough to fit over bandages, boots, and casts. 9. Don't wear slip-on shoes. Sneakers are preferred. 10. Bring your boot with you to the surgery center/hospital.  Also bring crutches or a walker if your physician has prescribed it for you.  If you do not have this equipment, it will be provided for you after surgery. 11. If you have not been contacted by the surgery center/hospital by the day before your surgery, call to confirm the date and time of your surgery. 12. Leave-time from work may vary depending on the type of surgery you have.  Appropriate arrangements should be made prior to surgery with your employer. 13. Prescriptions will be provided immediately following surgery by your doctor.  Fill these as soon as possible after surgery and take the medication as directed. Pain medications will not be refilled on weekends and must be approved by the doctor. 14. Remove nail polish on the operative foot and avoid getting pedicures prior to surgery. 15. Wash the night before surgery.  The night before surgery wash the foot and leg well with water and the antibacterial soap provided. Be sure to pay special attention to beneath the toenails and in between the toes.  Wash for at least three (3) minutes. Rinse thoroughly with water and dry well with a towel.  Perform this wash unless told not to do so by your physician.  Enclosed: 1 Ice pack (please put in freezer the night before surgery)   1 Hibiclens skin cleaner     Pre-op instructions  If you have any questions regarding the instructions, please do not hesitate to call our office.  [place]: [address], [place], [place] [address] -- [phone]  [place]: [address] [place], [place] [address] -- [phone]  [place]: [address]  [place], [place] [address] -- [phone]  [place]: [address], [address], [place], [place] [address] -- [phone]  Website:  [email or web address] 

## 2018-10-16 ENCOUNTER — Telehealth: Payer: Self-pay | Admitting: *Deleted

## 2018-10-16 NOTE — Telephone Encounter (Signed)
"  I'm calling to schedule my mother's surgery with Dr. [facility] at [facility].  What days does she have available?"  Dr. Marylene LandStover does surgeries on the second Thursday of each month at [facility].  "So her next date will be December 12, correct?"  Yes, she can do it on December 12.  "Okay, put my mother down for that date, that will be good."  Did they give her the history and physical forms that need to be completed by her primary care physician?  "Yes, she took them to her primary care physician.  He has already completed them and I dropped them off by your office in [place].  The visit was within the 30 day window."

## 2018-10-30 ENCOUNTER — Telehealth: Payer: Self-pay | Admitting: *Deleted

## 2018-10-30 ENCOUNTER — Encounter: Admit: 2018-10-30 | Discharge: 2018-10-30 | Payer: MEDICARE | Attending: Internal Medicine | Primary: Internal Medicine

## 2018-10-30 DIAGNOSIS — F0280 Dementia in other diseases classified elsewhere without behavioral disturbance: Secondary | ICD-10-CM

## 2018-10-30 DIAGNOSIS — M818 Other osteoporosis without current pathological fracture: Principal | ICD-10-CM

## 2018-10-30 DIAGNOSIS — F419 Anxiety disorder, unspecified: Secondary | ICD-10-CM

## 2018-10-30 DIAGNOSIS — F331 Major depressive disorder, recurrent, moderate: Secondary | ICD-10-CM

## 2018-10-30 DIAGNOSIS — G301 Alzheimer's disease with late onset: Secondary | ICD-10-CM

## 2018-10-30 DIAGNOSIS — I10 Essential (primary) hypertension: Secondary | ICD-10-CM

## 2018-10-30 DIAGNOSIS — F329 Major depressive disorder, single episode, unspecified: Secondary | ICD-10-CM

## 2018-10-30 DIAGNOSIS — Z0000 Encounter for general adult medical examination without abnormal findings: Secondary | ICD-10-CM

## 2018-10-30 NOTE — Telephone Encounter (Signed)
"  I'm scheduled for surgery on December 12.  I need to confirm the time.  Please give me a call."   I am returning your call.  Someone from the surgical center normally will call you a day or two prior to your surgery date and they will give you your arrival time.  "Okay, thank you."

## 2018-11-03 ENCOUNTER — Telehealth: Payer: Self-pay | Admitting: Sports Medicine

## 2018-11-04 MED ORDER — CYANOCOBALAMIN (VIT B-12) 1,000 MCG TABLET
ORAL_TABLET | Freq: Every day | ORAL | 3 refills | 0 days | Status: CP
Start: 2018-11-04 — End: 2019-11-04

## 2018-11-06 ENCOUNTER — Encounter: Payer: Self-pay | Admitting: Sports Medicine

## 2018-11-06 DIAGNOSIS — M2011 Hallux valgus (acquired), right foot: Secondary | ICD-10-CM | POA: Diagnosis not present

## 2018-11-07 ENCOUNTER — Telehealth: Payer: Self-pay | Admitting: Sports Medicine

## 2018-11-07 NOTE — Telephone Encounter (Signed)
Postop check phone call made to patient.  Patient reports that she is doing good no pain or problems with the foot.  Patient states that she did stay awake last night but however today was able to rest and take a nap and is feeling normal.  I reminded patient to continue with rest ice elevation and medications as prescribed.  Patient to follow-up in office as scheduled and advised patient if she has any problems or issues to call or come in sooner. -Dr. Marylene LandStover

## 2018-11-07 NOTE — Telephone Encounter (Signed)
Pt's son called for her surgery time at [facility] on Thursday 12 December. Stated they are trying to work out time for someone to be with her and that they wanted to go ahead an get that set up. Requested a call back at [phone].

## 2018-11-12 ENCOUNTER — Encounter: Payer: Self-pay | Admitting: Sports Medicine

## 2018-11-12 ENCOUNTER — Ambulatory Visit (INDEPENDENT_AMBULATORY_CARE_PROVIDER_SITE_OTHER): Payer: Medicare Other

## 2018-11-12 ENCOUNTER — Ambulatory Visit (INDEPENDENT_AMBULATORY_CARE_PROVIDER_SITE_OTHER): Payer: Medicare Other | Admitting: Sports Medicine

## 2018-11-12 VITALS — BP 119/65 | HR 95 | Temp 98.2°F | Resp 16

## 2018-11-12 DIAGNOSIS — M21611 Bunion of right foot: Secondary | ICD-10-CM

## 2018-11-12 DIAGNOSIS — M898X9 Other specified disorders of bone, unspecified site: Secondary | ICD-10-CM

## 2018-11-12 DIAGNOSIS — Z9889 Other specified postprocedural states: Secondary | ICD-10-CM

## 2018-11-12 DIAGNOSIS — M79671 Pain in right foot: Secondary | ICD-10-CM

## 2018-11-12 DIAGNOSIS — M2021 Hallux rigidus, right foot: Secondary | ICD-10-CM

## 2018-11-12 NOTE — Progress Notes (Signed)
Subjective: Janice Jones is a 77 y.o. female patient seen today in office for POV #1 (DOS 11/06/2018), S/P right Keller bunionectomy patient admits pain at surgical site 5 out of 10 that is soreness otherwise she is doing good only had to take Tylenol did not take any pain medicine, denies calf pain, denies headache, chest pain, shortness of breath, nausea, vomiting, fever, or chills. Patient is assisted by son at today's visit. No other issues noted.   There are no active problems to display for this patient.   Current Outpatient Medications on File Prior to Visit  Medication Sig Dispense Refill  . acetaminophen (TYLENOL) 500 MG tablet Take 500 mg by mouth every 6 (six) hours as needed.    . calcium carbonate (OSCAL) 1500 (600 Ca) MG TABS tablet Take by mouth 2 (two) times daily with a meal.    . citalopram (CELEXA) 20 MG tablet     . latanoprost (XALATAN) 0.005 % ophthalmic solution Place 1 drop into both eyes daily. 2.5 mL 0  . losartan (COZAAR) 50 MG tablet     . Melatonin 3 MG TABS Take by mouth.    . Melatonin 3-10 MG TABS Take by mouth.    . mirtazapine (REMERON) 15 MG tablet Take 15 mg by mouth at bedtime.    . vitamin B-12 (CYANOCOBALAMIN) 1000 MCG tablet Take by mouth.     No current facility-administered medications on file prior to visit.     Not on File  Objective: There were no vitals filed for this visit.  General: No acute distress, AAOx3  Right foot: Sutures intact with no gapping or dehiscence at surgical site, mild swelling to right first metatarsophalangeal joint with a small blister likely a friction blister since it is filled with clear fluid, no erythema, no warmth, no drainage, no other signs of infection noted, Capillary fill time <3 seconds in all digits, gross sensation present via light touch to right foot.  No pain or crepitation with range of motion right foot.  No pain with calf compression.   Post Op Xray, right foot consistent with Lorenz CoasterKeller bunionectomy  with resection of a portion of the base of the proximal phalanx, previous hardware intact from previous bunionectomy surgery, soft tissue swelling within normal limits for post op status.   Assessment and Plan:  Problem List Items Addressed This Visit    None    Visit Diagnoses    Bunion of great toe of right foot    -  Primary   Relevant Orders   DG Foot Complete Right   Post-operative state       Relevant Orders   DG Foot Complete Right   Bony exostosis       Hallux rigidus of right foot       Right foot pain           -Patient seen and evaluated -X-rays reviewed -Applied dry sterile dressing to surgical site right foot secured with ACE wrap and stockinet  -Advised patient to make sure to keep dressings clean, dry, and intact to right surgical site, removing the ACE as needed; advised patient that she cannot shower unless she has assistance from her children to help her wrap and protect the foot from getting wet -Advised patient to continue with post-op shoe on right foot -Advised patient to limit activity to necessity  -Advised patient to ice and elevate as necessary  -Continue with PRN meds -Will plan for suture removal at next office visit.  In the meantime, patient to call office if any issues or problems arise.   Landis Martins, DPM

## 2018-11-14 NOTE — Progress Notes (Signed)
DOS  11/06/2018  Removal of bone, right foot at bunion.

## 2018-11-17 ENCOUNTER — Other Ambulatory Visit: Payer: Self-pay | Admitting: Sports Medicine

## 2018-11-17 DIAGNOSIS — M21611 Bunion of right foot: Secondary | ICD-10-CM

## 2018-11-17 DIAGNOSIS — M898X9 Other specified disorders of bone, unspecified site: Secondary | ICD-10-CM

## 2018-11-17 DIAGNOSIS — Z9889 Other specified postprocedural states: Secondary | ICD-10-CM

## 2018-11-28 ENCOUNTER — Ambulatory Visit (INDEPENDENT_AMBULATORY_CARE_PROVIDER_SITE_OTHER): Payer: Medicare Other | Admitting: Sports Medicine

## 2018-11-28 ENCOUNTER — Encounter: Payer: Self-pay | Admitting: Sports Medicine

## 2018-11-28 VITALS — BP 88/61 | HR 85 | Temp 98.4°F | Resp 16

## 2018-11-28 DIAGNOSIS — Z9889 Other specified postprocedural states: Secondary | ICD-10-CM

## 2018-11-28 DIAGNOSIS — M2021 Hallux rigidus, right foot: Secondary | ICD-10-CM

## 2018-11-28 DIAGNOSIS — T148XXA Other injury of unspecified body region, initial encounter: Secondary | ICD-10-CM

## 2018-11-28 DIAGNOSIS — M79671 Pain in right foot: Secondary | ICD-10-CM

## 2018-11-28 DIAGNOSIS — M21611 Bunion of right foot: Secondary | ICD-10-CM

## 2018-11-28 DIAGNOSIS — M898X9 Other specified disorders of bone, unspecified site: Secondary | ICD-10-CM

## 2018-11-28 NOTE — Progress Notes (Signed)
Subjective: Janice Jones is a 78 y.o. female patient seen today in office for POV #2 (DOS 11/06/2018), S/P right Lorenz Coaster bunionectomy patient admits that she is doing great no pain however did take Tylenol a few times, denies calf pain, denies headache, chest pain, shortness of breath, nausea, vomiting, fever, or chills. Patient is assisted by daughter at today's visit. No other issues noted.   There are no active problems to display for this patient.   Current Outpatient Medications on File Prior to Visit  Medication Sig Dispense Refill  . acetaminophen (TYLENOL) 500 MG tablet Take 500 mg by mouth every 6 (six) hours as needed.    . calcium carbonate (OSCAL) 1500 (600 Ca) MG TABS tablet Take by mouth 2 (two) times daily with a meal.    . citalopram (CELEXA) 20 MG tablet     . latanoprost (XALATAN) 0.005 % ophthalmic solution Place 1 drop into both eyes daily. 2.5 mL 0  . losartan (COZAAR) 50 MG tablet     . Melatonin 3 MG TABS Take by mouth.    . Melatonin 3-10 MG TABS Take by mouth.    . mirtazapine (REMERON) 15 MG tablet Take 15 mg by mouth at bedtime.    . vitamin B-12 (CYANOCOBALAMIN) 1000 MCG tablet Take by mouth.     No current facility-administered medications on file prior to visit.     Not on File  Objective: There were no vitals filed for this visit.  General: No acute distress, AAOx3  Right foot: Sutures intact with no gapping or dehiscence at surgical site, mild swelling to right first metatarsophalangeal joint with a small blister likely a friction blister since it is filled with clear fluid, no erythema, no warmth, no drainage, no other signs of infection noted, Capillary fill time <3 seconds in all digits, gross sensation present via light touch to right foot.  No pain or crepitation with range of motion right foot.  No pain with calf compression.   Assessment and Plan:  Problem List Items Addressed This Visit    None    Visit Diagnoses    Bunion of great toe of  right foot    -  Primary   Post-operative state       Bony exostosis       Abrasion       Hallux rigidus of right foot       Right foot pain           -Patient seen and evaluated -Sutures removed -Applied dry sterile dressing to surgical site right foot secured with ACE wrap and stockinet  -Advised patient may shower and get the foot wet on tomorrow allowing Steri-Strips to fall off on their own and then redressed with compression sleeve to assist with edema control on Monday may try a normal tennis shoe or a shoe that is wide enough to accept her foot with the compression sleeve on -Advised patient to continue with post-op shoe on right foot until she can tolerate a normal shoe -Advised patient to limit activity to necessity  -Advised patient to ice and elevate as necessary  -Continue with PRN meds -Will plan for progressing activities depend on how patient is doing at next visit. In the meantime, patient to call office if any issues or problems arise.   Asencion Islam, DPM

## 2018-11-28 NOTE — Patient Instructions (Signed)
Remove guaze dressings and May shower tomorrow; after shower apply compression sleeve; remove sleeve at bedtime Allow steristrips to fall off on their on; do no apply cream/lotion to incision On Monday may try a normal shoe/tennis shoe and may drive as long as have on normal shoe

## 2018-12-12 ENCOUNTER — Ambulatory Visit (INDEPENDENT_AMBULATORY_CARE_PROVIDER_SITE_OTHER): Payer: Medicare Other | Admitting: Sports Medicine

## 2018-12-12 ENCOUNTER — Encounter: Payer: Self-pay | Admitting: Sports Medicine

## 2018-12-12 VITALS — BP 91/67 | HR 58 | Temp 97.8°F | Resp 16

## 2018-12-12 DIAGNOSIS — M898X9 Other specified disorders of bone, unspecified site: Secondary | ICD-10-CM

## 2018-12-12 DIAGNOSIS — Z9889 Other specified postprocedural states: Secondary | ICD-10-CM

## 2018-12-12 DIAGNOSIS — M21611 Bunion of right foot: Secondary | ICD-10-CM

## 2018-12-12 NOTE — Progress Notes (Signed)
Subjective: Janice Jones is a 78 y.o. female patient seen today in office for POV #3 (DOS 11/06/2018), S/P right Lorenz Coaster bunionectomy patient admits that she is doing great no pain however does admit swelling and difficulty sometimes with wearing normal shoes, chest pain, shortness of breath, nausea, vomiting, fever, or chills. No other issues noted.   There are no active problems to display for this patient.   Current Outpatient Medications on File Prior to Visit  Medication Sig Dispense Refill  . acetaminophen (TYLENOL) 500 MG tablet Take 500 mg by mouth every 6 (six) hours as needed.    . calcium carbonate (OSCAL) 1500 (600 Ca) MG TABS tablet Take by mouth 2 (two) times daily with a meal.    . citalopram (CELEXA) 20 MG tablet     . latanoprost (XALATAN) 0.005 % ophthalmic solution Place 1 drop into both eyes daily. 2.5 mL 0  . losartan (COZAAR) 50 MG tablet     . Melatonin 3 MG TABS Take by mouth.    . Melatonin 3-10 MG TABS Take by mouth.    . mirtazapine (REMERON) 15 MG tablet Take 15 mg by mouth at bedtime.    . vitamin B-12 (CYANOCOBALAMIN) 1000 MCG tablet Take by mouth.     No current facility-administered medications on file prior to visit.     Not on File  Objective: There were no vitals filed for this visit.  General: No acute distress, AAOx3  Right foot: Incision healing well with mild scabbing, mild swelling to right first metatarsophalangeal joint, no erythema, no warmth, no drainage, no other signs of infection noted, Capillary fill time <3 seconds in all digits, gross sensation present via light touch to right foot.  No pain or crepitation with range of motion right foot.  No pain with calf compression.   Assessment and Plan:  Problem List Items Addressed This Visit    None    Visit Diagnoses    Bunion of great toe of right foot    -  Primary   Bony exostosis       Post-operative state           -Patient seen and evaluated -Wound is healing well with focal  swelling over the joint consistent with postop status -Encourage patient to wear normal shoes to tolerance and to rest ice elevate as needed for edema control -Dispensed surgitube compression sleeve to wear during the day to assist with edema control -Will plan for x-rays and progressing activities depend on how patient is doing at next visit. In the meantime, patient to call office if any issues or problems arise.   Asencion Islam, DPM

## 2019-01-09 ENCOUNTER — Ambulatory Visit (INDEPENDENT_AMBULATORY_CARE_PROVIDER_SITE_OTHER): Payer: Medicare Other

## 2019-01-09 ENCOUNTER — Encounter: Payer: Self-pay | Admitting: Sports Medicine

## 2019-01-09 ENCOUNTER — Ambulatory Visit (INDEPENDENT_AMBULATORY_CARE_PROVIDER_SITE_OTHER): Payer: Medicare Other | Admitting: Sports Medicine

## 2019-01-09 VITALS — BP 97/57 | HR 58 | Temp 97.6°F | Resp 16

## 2019-01-09 DIAGNOSIS — M2021 Hallux rigidus, right foot: Secondary | ICD-10-CM

## 2019-01-09 DIAGNOSIS — Z9889 Other specified postprocedural states: Secondary | ICD-10-CM

## 2019-01-09 DIAGNOSIS — M21611 Bunion of right foot: Secondary | ICD-10-CM | POA: Diagnosis not present

## 2019-01-09 DIAGNOSIS — M898X9 Other specified disorders of bone, unspecified site: Secondary | ICD-10-CM

## 2019-01-09 NOTE — Progress Notes (Signed)
Subjective: Janice Jones is a 78 y.o. female patient seen today in office for POV #4 (DOS 11/06/2018), S/P right Lorenz Coaster bunionectomy patient admits that she is doing great no pain except sometimes with motion 2/10 and swelling, denies chest pain, shortness of breath, nausea, vomiting, fever, or chills. No other issues noted.   There are no active problems to display for this patient.   Current Outpatient Medications on File Prior to Visit  Medication Sig Dispense Refill  . acetaminophen (TYLENOL) 500 MG tablet Take 500 mg by mouth every 6 (six) hours as needed.    . calcium carbonate (OSCAL) 1500 (600 Ca) MG TABS tablet Take by mouth 2 (two) times daily with a meal.    . citalopram (CELEXA) 20 MG tablet     . latanoprost (XALATAN) 0.005 % ophthalmic solution Place 1 drop into both eyes daily. 2.5 mL 0  . losartan (COZAAR) 50 MG tablet     . Melatonin 3 MG TABS Take by mouth.    . Melatonin 3-10 MG TABS Take by mouth.    . mirtazapine (REMERON) 15 MG tablet Take 15 mg by mouth at bedtime.    . vitamin B-12 (CYANOCOBALAMIN) 1000 MCG tablet Take by mouth.     No current facility-administered medications on file prior to visit.     Not on File  Objective: There were no vitals filed for this visit.  General: No acute distress, AAOx3  Right foot: Incision well-healed mild swelling to right first metatarsophalangeal joint, no erythema, no warmth, no drainage, no other signs of infection noted, Capillary fill time <3 seconds in all digits, gross sensation present via light touch to right foot.  No pain or crepitation with range of motion right foot.  No pain with calf compression.   Assessment and Plan:  Problem List Items Addressed This Visit    None    Visit Diagnoses    Post-operative state    -  Primary   Relevant Orders   DG Foot Complete Right   Bunion of great toe of right foot       Relevant Orders   DG Foot Complete Right   Bony exostosis       Relevant Orders   DG Foot  Complete Right   Hallux rigidus of right foot       Relevant Orders   DG Foot Complete Right       -Patient seen and evaluated -X-rays reviewed consistent with postoperative status -Wound well-healed and advised patient that swelling may still be present and may take more significant time to go away -Continue with compression sleeve rest ice elevation and gentle range of motion exercises -Continue with normal shoes to tolerance and may slowly progress activities as tolerated -Patient discharged from postop care -Return PRN or sooner if problems or issues arise  Asencion Islam, DPM

## 2019-01-20 MED ORDER — CITALOPRAM 20 MG TABLET
Freq: Every day | ORAL | 3 refills | 0.00000 days | Status: CP
Start: 2019-01-20 — End: ?

## 2019-03-17 ENCOUNTER — Encounter
Admit: 2019-03-17 | Discharge: 2019-03-18 | Payer: MEDICARE | Attending: Geriatric Medicine | Primary: Geriatric Medicine

## 2019-03-17 DIAGNOSIS — M810 Age-related osteoporosis without current pathological fracture: Principal | ICD-10-CM

## 2019-07-28 ENCOUNTER — Encounter: Admit: 2019-07-28 | Discharge: 2019-07-28 | Payer: MEDICARE

## 2019-07-28 ENCOUNTER — Encounter: Admit: 2019-07-28 | Discharge: 2019-07-28 | Payer: MEDICARE | Attending: Internal Medicine | Primary: Internal Medicine

## 2019-07-28 DIAGNOSIS — M810 Age-related osteoporosis without current pathological fracture: Secondary | ICD-10-CM

## 2019-07-28 DIAGNOSIS — F0390 Unspecified dementia without behavioral disturbance: Secondary | ICD-10-CM

## 2019-07-28 DIAGNOSIS — Z0000 Encounter for general adult medical examination without abnormal findings: Secondary | ICD-10-CM

## 2019-07-28 DIAGNOSIS — R634 Abnormal weight loss: Secondary | ICD-10-CM

## 2019-07-28 DIAGNOSIS — F419 Anxiety disorder, unspecified: Secondary | ICD-10-CM

## 2019-07-28 DIAGNOSIS — Z7289 Other problems related to lifestyle: Secondary | ICD-10-CM

## 2019-07-28 DIAGNOSIS — I10 Essential (primary) hypertension: Secondary | ICD-10-CM

## 2019-07-28 DIAGNOSIS — F329 Major depressive disorder, single episode, unspecified: Secondary | ICD-10-CM

## 2019-07-28 DIAGNOSIS — R140 Abdominal distension (gaseous): Secondary | ICD-10-CM

## 2019-07-28 MED ORDER — MIRTAZAPINE 30 MG TABLET
ORAL_TABLET | Freq: Every evening | ORAL | 3 refills | 90.00000 days | Status: CP
Start: 2019-07-28 — End: ?

## 2019-07-28 MED ORDER — OMEPRAZOLE 20 MG CAPSULE,DELAYED RELEASE
ORAL_CAPSULE | Freq: Every day | ORAL | 3 refills | 30.00000 days | Status: CP
Start: 2019-07-28 — End: 2020-07-27

## 2019-07-29 MED ORDER — LOSARTAN 50 MG TABLET
ORAL_TABLET | Freq: Every day | ORAL | 3 refills | 90 days | Status: CP
Start: 2019-07-29 — End: ?

## 2019-07-31 DIAGNOSIS — D126 Benign neoplasm of colon, unspecified: Secondary | ICD-10-CM

## 2019-08-04 ENCOUNTER — Telehealth: Payer: Self-pay | Admitting: Gastroenterology

## 2019-08-04 NOTE — Telephone Encounter (Signed)
This is a pt from [place]  Pt requested to know when her previous colonoscopy was and when she is due.

## 2019-08-04 NOTE — Telephone Encounter (Signed)
LMOM for patient to call back and schdule an Office Visit with Dr. Lyndel Safe.

## 2019-10-02 ENCOUNTER — Encounter: Admit: 2019-10-02 | Discharge: 2019-10-03 | Payer: MEDICARE | Attending: Internal Medicine | Primary: Internal Medicine

## 2019-10-02 DIAGNOSIS — Z7289 Other problems related to lifestyle: Principal | ICD-10-CM

## 2019-10-02 DIAGNOSIS — F1027 Alcohol dependence with alcohol-induced persisting dementia: Principal | ICD-10-CM

## 2019-10-02 DIAGNOSIS — R634 Abnormal weight loss: Principal | ICD-10-CM

## 2019-10-02 DIAGNOSIS — I10 Essential (primary) hypertension: Principal | ICD-10-CM

## 2019-10-02 DIAGNOSIS — D126 Benign neoplasm of colon, unspecified: Principal | ICD-10-CM

## 2019-10-02 DIAGNOSIS — F419 Anxiety disorder, unspecified: Principal | ICD-10-CM

## 2019-10-02 DIAGNOSIS — F329 Major depressive disorder, single episode, unspecified: Secondary | ICD-10-CM

## 2019-10-02 DIAGNOSIS — M810 Age-related osteoporosis without current pathological fracture: Principal | ICD-10-CM

## 2019-12-03 ENCOUNTER — Other Ambulatory Visit: Payer: Self-pay

## 2019-12-03 ENCOUNTER — Encounter: Payer: Self-pay | Admitting: Gastroenterology

## 2019-12-03 ENCOUNTER — Telehealth (INDEPENDENT_AMBULATORY_CARE_PROVIDER_SITE_OTHER): Payer: Medicare Other | Admitting: Gastroenterology

## 2019-12-03 VITALS — Ht 63.0 in | Wt 100.0 lb

## 2019-12-03 DIAGNOSIS — Z800 Family history of malignant neoplasm of digestive organs: Secondary | ICD-10-CM | POA: Diagnosis not present

## 2019-12-03 DIAGNOSIS — Z86010 Personal history of colonic polyps: Secondary | ICD-10-CM

## 2019-12-03 DIAGNOSIS — Z1211 Encounter for screening for malignant neoplasm of colon: Secondary | ICD-10-CM | POA: Diagnosis not present

## 2019-12-03 NOTE — Progress Notes (Signed)
Chief Complaint:   Referring Provider:  Meda Klinefelter, MD      ASSESSMENT AND PLAN;   #1.  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  #2. FH colon cancer (dad age 80)  Plan: -We have discussed various options.  She is opted for Cologuard test for colorectal cancer screening.  I think it will be a reasonable choice.  We will arrange that. -Colonoscopy only if it is positive.   HPI:    Janice Jones is a 79 y.o. female  No nausea, vomiting, heartburn, regurgitation, odynophagia or dysphagia.  No significant diarrhea or constipation.  No melena or hematochezia. No unintentional weight loss. No abdominal pain. Was concerned about colorectal cancer screening. She did have difficult colonoscopy previously.   Past Medical History:  Diagnosis Date  . Anemia   . Anxiety and depression   . Family history of colon cancer   . GERD (gastroesophageal reflux disease)   . History of diverticulosis   . History of non-insulin dependent diabetes mellitus     Past Surgical History:  Procedure Laterality Date  . COLON SURGERY  12/15/2012   Gilman Buttner, DO [facility]  . COLONOSCOPY  11/03/2012   Large sessile colon polyp (biopsied and tattooed). Moderate-to-severe predominantly left colonic diverticulosis.     Family History  Problem Relation Age of Onset  . Colon cancer Father        died at age 69   . Esophageal cancer Neg Hx     Social History   Tobacco Use  . Smoking status: Never Smoker  . Smokeless tobacco: Never Used  Substance Use Topics  . Alcohol use: Yes    Alcohol/week: 1.0 standard drinks    Types: 1 Glasses of wine per week  . Drug use: Not Currently    Current Outpatient Medications  Medication Sig Dispense Refill  . Calcium Carbonate (CALTRATE 600 PO) Take 1 tablet by mouth daily.    . citalopram (CELEXA) 20 MG tablet Take 20 mg by mouth daily.     Marland Kitchen denosumab (PROLIA) 60 MG/ML SOSY injection Inject into the skin every 6  (six) months.    . latanoprost (XALATAN) 0.005 % ophthalmic solution 1 drop at bedtime.    Marland Kitchen losartan (COZAAR) 50 MG tablet Take 50 mg by mouth daily.     . Melatonin 3 MG TABS Take 2 tablets by mouth at bedtime.     . mirtazapine (REMERON) 30 MG tablet Take 30 mg by mouth at bedtime.    . Multiple Vitamin (MULTIVITAMIN) tablet Take 1 tablet by mouth daily.    Marland Kitchen omeprazole (PRILOSEC) 20 MG capsule 1 capsule daily.    . vitamin B-12 (CYANOCOBALAMIN) 1000 MCG tablet Take 1,000 mcg by mouth daily.    Marland Kitchen acetaminophen (TYLENOL) 500 MG tablet Take 500 mg by mouth every 6 (six) hours as needed.    . calcium carbonate (OSCAL) 1500 (600 Ca) MG TABS tablet Take by mouth 2 (two) times daily with a meal.     No current facility-administered medications for this visit.    Not on File  Review of Systems:  Constitutional: Denies fever, chills, diaphoresis, appetite change and has some fatigue.  HEENT: Denies photophobia, eye pain, redness, hearing loss, ear pain, congestion, sore throat, rhinorrhea, sneezing, mouth sores, neck pain, neck stiffness and tinnitus.   Respiratory: Denies SOB, DOE, cough, chest tightness,  and wheezing.   Cardiovascular: Denies chest pain, palpitations and leg swelling.  Genitourinary: Denies dysuria,  urgency, frequency, hematuria, flank pain and difficulty urinating.  Musculoskeletal: Denies myalgias, back pain, joint swelling, arthralgias and gait problem.  Skin: No rash.  Neurological: Denies dizziness, seizures, syncope, weakness, light-headedness, numbness and headaches.  Hematological: Denies adenopathy. Easy bruising, personal or family bleeding history  Psychiatric/Behavioral: Has anxiety or depression.  Has been having mild memory loss.     Physical Exam:    Ht 5\' 3"  (1.6 m)   Wt 100 lb (45.4 kg)   BMI 17.71 kg/m  Wt Readings from Last 3 Encounters:  12/03/19 100 lb (45.4 kg)  09/24/18 115 lb (52.2 kg)   Televisit  I connected with  Janice Jones on  12/03/19 by a video enabled telemedicine application and verified that I am speaking with the correct person using two identifiers.   I discussed the limitations of evaluation and management by telemedicine. The patient expressed understanding and agreed to proceed.  Time spent- 20 min   01/31/20, MD 12/03/2019, 11:36 AM  Cc: 01/31/2020, MD

## 2019-12-03 NOTE — Patient Instructions (Signed)
If you are age 80 or older, your body mass index should be between 23-30. Your Body mass index is 17.71 kg/m. If this is out of the aforementioned range listed, please consider follow up with your Primary Care Provider.  If you are age 1 or younger, your body mass index should be between 19-25. Your Body mass index is 17.71 kg/m. If this is out of the aformentioned range listed, please consider follow up with your Primary Care Provider.   Your provider has ordered Cologuard testing as an option for colon cancer screening. This is performed by [employer] and may be out of network with your insurance. PRIOR to completing the test, it is YOUR responsibility to contact your insurance about covered benefits for this test. Your out of pocket expense could be anywhere from $0.00 to $649.00.   When you call to check coverage with your insurer, please provide the following information:   -The ONLY provider of Cologuard is [facility], dispensing  - CPT code for Cologuard is [phone].  [employer] NPI # [id number]  -[employer] Tax ID # [id number]   We have already sent your demographic and insurance information to [employer] (phone number [phone]) and they should contact you within the next week regarding your test. If you have not heard from them within the next week, please call our office at [phone].   Thank you,  Dr. Lynann Bologna

## 2019-12-18 MED ORDER — OMEPRAZOLE 20 MG CAPSULE,DELAYED RELEASE
ORAL_CAPSULE | 3 refills | 0 days | Status: CP
Start: 2019-12-18 — End: ?

## 2020-01-05 ENCOUNTER — Encounter: Admit: 2020-01-05 | Discharge: 2020-01-05 | Payer: MEDICARE

## 2020-01-05 ENCOUNTER — Ambulatory Visit: Admit: 2020-01-05 | Discharge: 2020-01-05 | Payer: MEDICARE | Attending: Internal Medicine | Primary: Internal Medicine

## 2020-01-05 DIAGNOSIS — M810 Age-related osteoporosis without current pathological fracture: Principal | ICD-10-CM

## 2020-01-05 DIAGNOSIS — D7589 Other specified diseases of blood and blood-forming organs: Principal | ICD-10-CM

## 2020-01-25 MED ORDER — CITALOPRAM 20 MG TABLET
ORAL_TABLET | 3 refills | 0 days | Status: CP
Start: 2020-01-25 — End: ?

## 2020-01-29 MED ORDER — LORAZEPAM 0.5 MG TABLET
ORAL_TABLET | Freq: Every day | ORAL | 0 refills | 3.00000 days | Status: CP
Start: 2020-01-29 — End: 2020-02-01

## 2020-03-29 ENCOUNTER — Encounter: Admit: 2020-03-29 | Discharge: 2020-03-30 | Payer: MEDICARE | Attending: Internal Medicine | Primary: Internal Medicine

## 2020-03-29 DIAGNOSIS — F0280 Dementia in other diseases classified elsewhere without behavioral disturbance: Principal | ICD-10-CM

## 2020-03-29 DIAGNOSIS — G301 Alzheimer's disease with late onset: Secondary | ICD-10-CM

## 2020-03-29 DIAGNOSIS — D126 Benign neoplasm of colon, unspecified: Principal | ICD-10-CM

## 2020-03-29 DIAGNOSIS — I10 Essential (primary) hypertension: Principal | ICD-10-CM

## 2020-03-29 DIAGNOSIS — F329 Major depressive disorder, single episode, unspecified: Principal | ICD-10-CM

## 2020-03-29 DIAGNOSIS — F331 Major depressive disorder, recurrent, moderate: Principal | ICD-10-CM

## 2020-03-29 DIAGNOSIS — M810 Age-related osteoporosis without current pathological fracture: Principal | ICD-10-CM

## 2020-03-29 DIAGNOSIS — F419 Anxiety disorder, unspecified: Secondary | ICD-10-CM

## 2020-03-29 DIAGNOSIS — F1020 Alcohol dependence, uncomplicated: Principal | ICD-10-CM

## 2020-04-15 MED ORDER — OMEPRAZOLE 20 MG CAPSULE,DELAYED RELEASE
ORAL_CAPSULE | 3 refills | 0 days | Status: CP
Start: 2020-04-15 — End: ?

## 2020-05-12 DIAGNOSIS — R109 Unspecified abdominal pain: Secondary | ICD-10-CM

## 2020-05-12 DIAGNOSIS — K449 Diaphragmatic hernia without obstruction or gangrene: Secondary | ICD-10-CM

## 2020-05-12 DIAGNOSIS — K56609 Unspecified intestinal obstruction, unspecified as to partial versus complete obstruction: Secondary | ICD-10-CM

## 2020-05-12 DIAGNOSIS — R112 Nausea with vomiting, unspecified: Secondary | ICD-10-CM | POA: Diagnosis not present

## 2020-05-12 DIAGNOSIS — D72829 Elevated white blood cell count, unspecified: Secondary | ICD-10-CM | POA: Diagnosis not present

## 2020-05-12 DIAGNOSIS — E876 Hypokalemia: Secondary | ICD-10-CM

## 2020-05-13 DIAGNOSIS — K56609 Unspecified intestinal obstruction, unspecified as to partial versus complete obstruction: Secondary | ICD-10-CM | POA: Diagnosis not present

## 2020-05-13 DIAGNOSIS — R112 Nausea with vomiting, unspecified: Secondary | ICD-10-CM | POA: Diagnosis not present

## 2020-05-13 DIAGNOSIS — D72829 Elevated white blood cell count, unspecified: Secondary | ICD-10-CM | POA: Diagnosis not present

## 2020-05-13 DIAGNOSIS — R109 Unspecified abdominal pain: Secondary | ICD-10-CM | POA: Diagnosis not present

## 2020-05-14 DIAGNOSIS — R109 Unspecified abdominal pain: Secondary | ICD-10-CM | POA: Diagnosis not present

## 2020-05-14 DIAGNOSIS — K56609 Unspecified intestinal obstruction, unspecified as to partial versus complete obstruction: Secondary | ICD-10-CM | POA: Diagnosis not present

## 2020-05-14 DIAGNOSIS — R112 Nausea with vomiting, unspecified: Secondary | ICD-10-CM | POA: Diagnosis not present

## 2020-05-14 DIAGNOSIS — D72829 Elevated white blood cell count, unspecified: Secondary | ICD-10-CM | POA: Diagnosis not present

## 2020-05-15 DIAGNOSIS — R112 Nausea with vomiting, unspecified: Secondary | ICD-10-CM | POA: Diagnosis not present

## 2020-05-15 DIAGNOSIS — K56609 Unspecified intestinal obstruction, unspecified as to partial versus complete obstruction: Secondary | ICD-10-CM | POA: Diagnosis not present

## 2020-05-15 DIAGNOSIS — D72829 Elevated white blood cell count, unspecified: Secondary | ICD-10-CM | POA: Diagnosis not present

## 2020-05-15 DIAGNOSIS — R109 Unspecified abdominal pain: Secondary | ICD-10-CM | POA: Diagnosis not present

## 2020-05-16 DIAGNOSIS — R112 Nausea with vomiting, unspecified: Secondary | ICD-10-CM | POA: Diagnosis not present

## 2020-05-16 DIAGNOSIS — D72829 Elevated white blood cell count, unspecified: Secondary | ICD-10-CM | POA: Diagnosis not present

## 2020-05-16 DIAGNOSIS — R109 Unspecified abdominal pain: Secondary | ICD-10-CM | POA: Diagnosis not present

## 2020-05-16 DIAGNOSIS — K56609 Unspecified intestinal obstruction, unspecified as to partial versus complete obstruction: Secondary | ICD-10-CM | POA: Diagnosis not present

## 2020-05-17 DIAGNOSIS — D72829 Elevated white blood cell count, unspecified: Secondary | ICD-10-CM | POA: Diagnosis not present

## 2020-05-17 DIAGNOSIS — R112 Nausea with vomiting, unspecified: Secondary | ICD-10-CM | POA: Diagnosis not present

## 2020-05-17 DIAGNOSIS — R109 Unspecified abdominal pain: Secondary | ICD-10-CM | POA: Diagnosis not present

## 2020-05-17 DIAGNOSIS — K56609 Unspecified intestinal obstruction, unspecified as to partial versus complete obstruction: Secondary | ICD-10-CM | POA: Diagnosis not present

## 2020-05-18 DIAGNOSIS — K56609 Unspecified intestinal obstruction, unspecified as to partial versus complete obstruction: Secondary | ICD-10-CM | POA: Diagnosis not present

## 2020-05-18 DIAGNOSIS — D72829 Elevated white blood cell count, unspecified: Secondary | ICD-10-CM | POA: Diagnosis not present

## 2020-05-18 DIAGNOSIS — R109 Unspecified abdominal pain: Secondary | ICD-10-CM | POA: Diagnosis not present

## 2020-05-18 DIAGNOSIS — R112 Nausea with vomiting, unspecified: Secondary | ICD-10-CM | POA: Diagnosis not present

## 2020-05-19 DIAGNOSIS — R112 Nausea with vomiting, unspecified: Secondary | ICD-10-CM | POA: Diagnosis not present

## 2020-05-19 DIAGNOSIS — D72829 Elevated white blood cell count, unspecified: Secondary | ICD-10-CM | POA: Diagnosis not present

## 2020-05-19 DIAGNOSIS — K56609 Unspecified intestinal obstruction, unspecified as to partial versus complete obstruction: Secondary | ICD-10-CM | POA: Diagnosis not present

## 2020-05-19 DIAGNOSIS — R109 Unspecified abdominal pain: Secondary | ICD-10-CM | POA: Diagnosis not present

## 2020-05-24 ENCOUNTER — Encounter: Admit: 2020-05-24 | Discharge: 2020-05-25 | Payer: MEDICARE | Attending: Internal Medicine | Primary: Internal Medicine

## 2020-05-24 DIAGNOSIS — D126 Benign neoplasm of colon, unspecified: Principal | ICD-10-CM

## 2020-05-24 DIAGNOSIS — Z9049 Acquired absence of other specified parts of digestive tract: Principal | ICD-10-CM

## 2020-05-24 DIAGNOSIS — D72829 Elevated white blood cell count, unspecified: Principal | ICD-10-CM

## 2020-05-24 DIAGNOSIS — I959 Hypotension, unspecified: Principal | ICD-10-CM

## 2020-05-24 DIAGNOSIS — Z933 Colostomy status: Principal | ICD-10-CM

## 2020-07-01 MED ORDER — MIRTAZAPINE 30 MG TABLET
ORAL_TABLET | ORAL | 3 refills | 0.00000 days | Status: CP
Start: 2020-07-01 — End: ?

## 2020-07-05 ENCOUNTER — Ambulatory Visit: Admit: 2020-07-05 | Discharge: 2020-07-05 | Payer: MEDICARE

## 2020-07-05 ENCOUNTER — Ambulatory Visit: Admit: 2020-07-05 | Discharge: 2020-07-05 | Payer: MEDICARE | Attending: Internal Medicine | Primary: Internal Medicine

## 2020-07-05 DIAGNOSIS — F331 Major depressive disorder, recurrent, moderate: Principal | ICD-10-CM

## 2020-07-05 DIAGNOSIS — F0280 Dementia in other diseases classified elsewhere without behavioral disturbance: Principal | ICD-10-CM

## 2020-07-05 DIAGNOSIS — D649 Anemia, unspecified: Principal | ICD-10-CM

## 2020-07-05 DIAGNOSIS — I10 Essential (primary) hypertension: Principal | ICD-10-CM

## 2020-07-05 DIAGNOSIS — D7589 Other specified diseases of blood and blood-forming organs: Principal | ICD-10-CM

## 2020-07-05 DIAGNOSIS — F329 Major depressive disorder, single episode, unspecified: Principal | ICD-10-CM

## 2020-07-05 DIAGNOSIS — G301 Alzheimer's disease with late onset: Principal | ICD-10-CM

## 2020-07-05 DIAGNOSIS — N289 Disorder of kidney and ureter, unspecified: Principal | ICD-10-CM

## 2020-07-05 DIAGNOSIS — M810 Age-related osteoporosis without current pathological fracture: Principal | ICD-10-CM

## 2020-07-05 DIAGNOSIS — F419 Anxiety disorder, unspecified: Principal | ICD-10-CM

## 2020-07-05 DIAGNOSIS — Z7289 Other problems related to lifestyle: Principal | ICD-10-CM

## 2020-07-05 DIAGNOSIS — Z9049 Acquired absence of other specified parts of digestive tract: Principal | ICD-10-CM

## 2020-07-05 DIAGNOSIS — Z933 Colostomy status: Principal | ICD-10-CM

## 2020-07-10 DIAGNOSIS — N289 Disorder of kidney and ureter, unspecified: Principal | ICD-10-CM

## 2020-07-11 DIAGNOSIS — N289 Disorder of kidney and ureter, unspecified: Principal | ICD-10-CM

## 2020-07-12 MED ORDER — LOSARTAN 25 MG TABLET
ORAL_TABLET | Freq: Every day | ORAL | 3 refills | 90.00000 days | Status: CP
Start: 2020-07-12 — End: 2021-07-12

## 2020-07-20 DIAGNOSIS — N289 Disorder of kidney and ureter, unspecified: Principal | ICD-10-CM

## 2020-08-03 ENCOUNTER — Ambulatory Visit: Admit: 2020-08-03 | Discharge: 2020-08-04 | Payer: MEDICARE | Attending: Internal Medicine | Primary: Internal Medicine

## 2020-08-03 DIAGNOSIS — I10 Essential (primary) hypertension: Principal | ICD-10-CM

## 2020-08-03 DIAGNOSIS — K9400 Colostomy complication, unspecified: Principal | ICD-10-CM

## 2020-08-03 MED ORDER — LOSARTAN 25 MG TABLET
ORAL_TABLET | Freq: Every day | ORAL | 3 refills | 45.00000 days | Status: CP
Start: 2020-08-03 — End: 2021-01-30

## 2020-09-30 ENCOUNTER — Encounter: Admit: 2020-09-30 | Discharge: 2020-10-01 | Payer: MEDICARE | Attending: Internal Medicine | Primary: Internal Medicine

## 2020-09-30 DIAGNOSIS — G301 Alzheimer's disease with late onset: Principal | ICD-10-CM

## 2020-09-30 DIAGNOSIS — F419 Anxiety disorder, unspecified: Principal | ICD-10-CM

## 2020-09-30 DIAGNOSIS — F331 Major depressive disorder, recurrent, moderate: Principal | ICD-10-CM

## 2020-09-30 DIAGNOSIS — F32A Anxiety and depression: Principal | ICD-10-CM

## 2020-09-30 DIAGNOSIS — I10 Essential (primary) hypertension: Principal | ICD-10-CM

## 2020-09-30 DIAGNOSIS — N289 Disorder of kidney and ureter, unspecified: Principal | ICD-10-CM

## 2020-09-30 DIAGNOSIS — F0280 Dementia in other diseases classified elsewhere without behavioral disturbance: Principal | ICD-10-CM

## 2020-09-30 DIAGNOSIS — Z933 Colostomy status: Principal | ICD-10-CM

## 2020-09-30 DIAGNOSIS — D126 Benign neoplasm of colon, unspecified: Principal | ICD-10-CM

## 2020-09-30 DIAGNOSIS — M810 Age-related osteoporosis without current pathological fracture: Principal | ICD-10-CM

## 2020-11-15 ENCOUNTER — Ambulatory Visit: Admit: 2020-11-15 | Discharge: 2020-11-16 | Payer: MEDICARE | Attending: Internal Medicine | Primary: Internal Medicine

## 2020-11-15 DIAGNOSIS — I10 Essential (primary) hypertension: Principal | ICD-10-CM

## 2020-11-15 DIAGNOSIS — R159 Full incontinence of feces: Principal | ICD-10-CM

## 2020-11-15 DIAGNOSIS — R195 Other fecal abnormalities: Principal | ICD-10-CM

## 2020-11-15 DIAGNOSIS — F32A Anxiety and depression: Principal | ICD-10-CM

## 2020-11-15 DIAGNOSIS — F0280 Dementia in other diseases classified elsewhere without behavioral disturbance: Principal | ICD-10-CM

## 2020-11-15 DIAGNOSIS — G301 Alzheimer's disease with late onset: Principal | ICD-10-CM

## 2020-11-15 DIAGNOSIS — N289 Disorder of kidney and ureter, unspecified: Principal | ICD-10-CM

## 2020-11-15 DIAGNOSIS — F331 Major depressive disorder, recurrent, moderate: Principal | ICD-10-CM

## 2020-11-15 DIAGNOSIS — K435 Parastomal hernia without obstruction or  gangrene: Principal | ICD-10-CM

## 2020-11-15 DIAGNOSIS — Z9889 Other specified postprocedural states: Principal | ICD-10-CM

## 2020-11-15 DIAGNOSIS — M810 Age-related osteoporosis without current pathological fracture: Principal | ICD-10-CM

## 2020-11-15 DIAGNOSIS — F419 Anxiety disorder, unspecified: Principal | ICD-10-CM

## 2020-11-15 DIAGNOSIS — D7589 Other specified diseases of blood and blood-forming organs: Principal | ICD-10-CM

## 2020-11-15 DIAGNOSIS — Z9049 Acquired absence of other specified parts of digestive tract: Principal | ICD-10-CM

## 2020-11-15 DIAGNOSIS — D126 Benign neoplasm of colon, unspecified: Principal | ICD-10-CM

## 2021-01-28 ENCOUNTER — Ambulatory Visit: Admission: AD | Admit: 2021-01-28 | Payer: Medicare Other | Source: Other Acute Inpatient Hospital | Admitting: Surgery

## 2021-01-29 ENCOUNTER — Observation Stay (HOSPITAL_COMMUNITY)
Admission: AD | Admit: 2021-01-29 | Discharge: 2021-01-30 | Disposition: A | Discharge status: Home / Self Care | Payer: Medicare Other | Source: Other Acute Inpatient Hospital | Attending: General Surgery | Admitting: General Surgery

## 2021-01-29 DIAGNOSIS — Z20822 Contact with and (suspected) exposure to covid-19: Secondary | ICD-10-CM | POA: Insufficient documentation

## 2021-01-29 DIAGNOSIS — F32A Depression, unspecified: Secondary | ICD-10-CM | POA: Insufficient documentation

## 2021-01-29 DIAGNOSIS — S82421A Displaced transverse fracture of shaft of right fibula, initial encounter for closed fracture: Secondary | ICD-10-CM | POA: Diagnosis not present

## 2021-01-29 DIAGNOSIS — F419 Anxiety disorder, unspecified: Secondary | ICD-10-CM | POA: Insufficient documentation

## 2021-01-29 DIAGNOSIS — Z8639 Personal history of other endocrine, nutritional and metabolic disease: Secondary | ICD-10-CM | POA: Insufficient documentation

## 2021-01-29 DIAGNOSIS — Z79899 Other long term (current) drug therapy: Secondary | ICD-10-CM | POA: Diagnosis not present

## 2021-01-29 DIAGNOSIS — I10 Essential (primary) hypertension: Secondary | ICD-10-CM | POA: Insufficient documentation

## 2021-01-29 DIAGNOSIS — S2249XA Multiple fractures of ribs, unspecified side, initial encounter for closed fracture: Secondary | ICD-10-CM

## 2021-01-29 DIAGNOSIS — R918 Other nonspecific abnormal finding of lung field: Secondary | ICD-10-CM | POA: Diagnosis not present

## 2021-01-29 DIAGNOSIS — S32019A Unspecified fracture of first lumbar vertebra, initial encounter for closed fracture: Secondary | ICD-10-CM | POA: Diagnosis not present

## 2021-01-29 DIAGNOSIS — S2241XA Multiple fractures of ribs, right side, initial encounter for closed fracture: Secondary | ICD-10-CM | POA: Insufficient documentation

## 2021-01-29 DIAGNOSIS — S32029A Unspecified fracture of second lumbar vertebra, initial encounter for closed fracture: Secondary | ICD-10-CM | POA: Diagnosis not present

## 2021-01-29 DIAGNOSIS — K219 Gastro-esophageal reflux disease without esophagitis: Secondary | ICD-10-CM | POA: Insufficient documentation

## 2021-01-29 DIAGNOSIS — M810 Age-related osteoporosis without current pathological fracture: Secondary | ICD-10-CM | POA: Diagnosis not present

## 2021-01-29 DIAGNOSIS — S82831A Other fracture of upper and lower end of right fibula, initial encounter for closed fracture: Secondary | ICD-10-CM

## 2021-01-29 DIAGNOSIS — R2689 Other abnormalities of gait and mobility: Secondary | ICD-10-CM | POA: Diagnosis not present

## 2021-01-29 LAB — COMPREHENSIVE METABOLIC PANEL
ALT: 39 U/L (ref 0–44)
AST: 67 U/L — ABNORMAL HIGH (ref 15–41)
Albumin: 3.5 g/dL (ref 3.5–5.0)
Alkaline Phosphatase: 45 U/L (ref 38–126)
Anion gap: 11 (ref 5–15)
BUN: 12 mg/dL (ref 8–23)
CO2: 21 mmol/L — ABNORMAL LOW (ref 22–32)
Calcium: 8.7 mg/dL — ABNORMAL LOW (ref 8.9–10.3)
Chloride: 104 mmol/L (ref 98–111)
Creatinine, Ser: 0.84 mg/dL (ref 0.44–1.00)
GFR, Estimated: >60 mL/min (ref >60)
Glucose, Bld: 141 mg/dL — ABNORMAL HIGH (ref 70–99)
Potassium: 4.3 mmol/L (ref 3.5–5.1)
Sodium: 136 mmol/L (ref 135–145)
Total Bilirubin: 1.6 mg/dL — ABNORMAL HIGH (ref 0.3–1.2)
Total Protein: 6.1 g/dL — ABNORMAL LOW (ref 6.5–8.1)

## 2021-01-29 LAB — CBC
HCT: 35.1 % — ABNORMAL LOW (ref 36.0–46.0)
Hemoglobin: 11.4 g/dL — ABNORMAL LOW (ref 12.0–15.0)
MCH: 31.3 pg (ref 26.0–34.0)
MCHC: 32.5 g/dL (ref 30.0–36.0)
MCV: 96.4 fL (ref 80.0–100.0)
Platelets: 251 10*3/uL (ref 150–400)
RBC: 3.64 MIL/uL — ABNORMAL LOW (ref 3.87–5.11)
RDW: 14.2 % (ref 11.5–15.5)
WBC: 8.3 10*3/uL (ref 4.0–10.5)
nRBC: 0 % (ref 0.0–0.2)

## 2021-01-29 LAB — GLUCOSE, CAPILLARY: Glucose-Capillary: 129 mg/dL — ABNORMAL HIGH (ref 70–99)

## 2021-01-29 LAB — PROTIME-INR
INR: 1 (ref 0.8–1.2)
Prothrombin Time: 12.9 seconds (ref 11.4–15.2)

## 2021-01-29 MED ORDER — ACETAMINOPHEN 325 MG PO TABS
650.0000 mg | ORAL_TABLET | Freq: Four times a day (QID) | ORAL | Status: DC
  Administered 2021-01-29 – 2021-01-30 (×3): 650 mg via ORAL
  Filled 2021-01-29 (×4): qty 2

## 2021-01-29 MED ORDER — ONDANSETRON 4 MG PO TBDP: 4.0000 mg | ORAL_TABLET | Freq: Four times a day (QID) | ORAL | Status: DC | PRN

## 2021-01-29 MED ORDER — ENOXAPARIN SODIUM 30 MG/0.3ML ~~LOC~~ SOLN: 30.0000 mg | SUBCUTANEOUS | Status: DC

## 2021-01-29 MED ORDER — LIDOCAINE 5 % EX PTCH: 1.0000 | MEDICATED_PATCH | CUTANEOUS | Status: DC

## 2021-01-29 MED ORDER — ONDANSETRON HCL 4 MG/2ML IJ SOLN: 4.0000 mg | Freq: Four times a day (QID) | INTRAMUSCULAR | Status: DC | PRN

## 2021-01-29 MED ORDER — TRAMADOL HCL 50 MG PO TABS: 50.0000 mg | ORAL_TABLET | Freq: Four times a day (QID) | ORAL | Status: DC | PRN

## 2021-01-29 MED ORDER — DOCUSATE SODIUM 100 MG PO CAPS
100.0000 mg | ORAL_CAPSULE | Freq: Two times a day (BID) | ORAL | Status: DC
  Administered 2021-01-30: 100 mg via ORAL
  Filled 2021-01-29: qty 1

## 2021-01-29 NOTE — Progress Notes (Signed)
Patient refused Lovenox. MD on call made aware. Will continue to monitor

## 2021-01-29 NOTE — Progress Notes (Signed)
Son and daughter state that pt has mild dementia but is independent and able to drive usually. But, she has been hospitalized three times in the past year and each time, she gets agitated while in the hospital and tries to get up out of bed.  Dr. Dossie Der notified and safety sitter ordered.
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
## 2021-01-30 ENCOUNTER — Observation Stay (HOSPITAL_COMMUNITY): Payer: Medicare Other

## 2021-01-30 DIAGNOSIS — S2241XA Multiple fractures of ribs, right side, initial encounter for closed fracture: Secondary | ICD-10-CM | POA: Diagnosis not present

## 2021-01-30 LAB — HEMOGLOBIN A1C
Hgb A1c MFr Bld: 5.6 % (ref 4.8–5.6)
Mean Plasma Glucose: 114.02 mg/dL

## 2021-01-30 LAB — CBC
HCT: 38.9 % (ref 36.0–46.0)
Hemoglobin: 13 g/dL (ref 12.0–15.0)
MCH: 32.3 pg (ref 26.0–34.0)
MCHC: 33.4 g/dL (ref 30.0–36.0)
MCV: 96.5 fL (ref 80.0–100.0)
Platelets: 289 10*3/uL (ref 150–400)
RBC: 4.03 MIL/uL (ref 3.87–5.11)
RDW: 14.2 % (ref 11.5–15.5)
WBC: 9 10*3/uL (ref 4.0–10.5)
nRBC: 0 % (ref 0.0–0.2)

## 2021-01-30 LAB — COMPREHENSIVE METABOLIC PANEL
ALT: 39 U/L (ref 0–44)
AST: 48 U/L — ABNORMAL HIGH (ref 15–41)
Albumin: 3.7 g/dL (ref 3.5–5.0)
Alkaline Phosphatase: 46 U/L (ref 38–126)
Anion gap: 12 (ref 5–15)
BUN: 9 mg/dL (ref 8–23)
CO2: 23 mmol/L (ref 22–32)
Calcium: 9 mg/dL (ref 8.9–10.3)
Chloride: 103 mmol/L (ref 98–111)
Creatinine, Ser: 0.8 mg/dL (ref 0.44–1.00)
GFR, Estimated: >60 mL/min (ref >60)
Glucose, Bld: 101 mg/dL — ABNORMAL HIGH (ref 70–99)
Potassium: 2.9 mmol/L — ABNORMAL LOW (ref 3.5–5.1)
Sodium: 138 mmol/L (ref 135–145)
Total Bilirubin: 0.6 mg/dL (ref 0.3–1.2)
Total Protein: 7 g/dL (ref 6.5–8.1)

## 2021-01-30 LAB — GLUCOSE, CAPILLARY
Glucose-Capillary: 132 mg/dL — ABNORMAL HIGH (ref 70–99)
Glucose-Capillary: 97 mg/dL (ref 70–99)

## 2021-01-30 LAB — MAGNESIUM: Magnesium: 2 mg/dL (ref 1.7–2.4)

## 2021-01-30 LAB — SARS CORONAVIRUS 2 (TAT 6-24 HRS): SARS Coronavirus 2: NEGATIVE

## 2021-01-30 MED ORDER — PANTOPRAZOLE SODIUM 40 MG PO TBEC
40.0000 mg | DELAYED_RELEASE_TABLET | Freq: Every day | ORAL | Status: DC
  Administered 2021-01-30: 40 mg via ORAL
  Filled 2021-01-30: qty 1

## 2021-01-30 MED ORDER — LOSARTAN POTASSIUM 50 MG PO TABS
50.0000 mg | ORAL_TABLET | Freq: Every day | ORAL | Status: DC
  Administered 2021-01-30: 50 mg via ORAL
  Filled 2021-01-30: qty 1

## 2021-01-30 MED ORDER — POTASSIUM CHLORIDE CRYS ER 20 MEQ PO TBCR
40.0000 meq | EXTENDED_RELEASE_TABLET | Freq: Two times a day (BID) | ORAL | Status: DC
  Administered 2021-01-30: 40 meq via ORAL
  Filled 2021-01-30: qty 2

## 2021-01-30 MED ORDER — LATANOPROST 0.005 % OP SOLN
1.0000 [drp] | Freq: Every day | OPHTHALMIC | Status: DC
  Filled 2021-01-30: qty 2.5

## 2021-01-30 MED ORDER — CITALOPRAM HYDROBROMIDE 20 MG PO TABS
20.0000 mg | ORAL_TABLET | Freq: Every day | ORAL | Status: DC
Start: 2021-01-30 — End: 2021-01-30
  Administered 2021-01-30: 20 mg via ORAL
  Filled 2021-01-30: qty 1

## 2021-01-30 MED ORDER — ADULT MULTIVITAMIN W/MINERALS CH
1.0000 | ORAL_TABLET | Freq: Every day | ORAL | Status: DC
  Administered 2021-01-30: 1 via ORAL
  Filled 2021-01-30: qty 1

## 2021-01-30 MED ORDER — POLYETHYLENE GLYCOL 3350 17 G PO PACK: 17.0000 g | PACK | Freq: Every day | ORAL | Status: DC | PRN

## 2021-01-30 MED ORDER — MELATONIN 3 MG PO TABS
6.0000 mg | ORAL_TABLET | Freq: Every day | ORAL | Status: DC
Start: 2021-01-30 — End: 2021-01-30

## 2021-01-30 MED ORDER — VITAMIN B-12 1000 MCG PO TABS
1000.0000 ug | ORAL_TABLET | Freq: Every day | ORAL | Status: DC
  Administered 2021-01-30: 1000 ug via ORAL
  Filled 2021-01-30: qty 1

## 2021-01-30 MED ORDER — POLYETHYLENE GLYCOL 3350 17 G PO PACK: 17.0000 g | PACK | Freq: Every day | ORAL | 0 refills | Status: AC | PRN

## 2021-01-30 MED ORDER — MIRTAZAPINE 15 MG PO TABS
30.0000 mg | ORAL_TABLET | Freq: Every day | ORAL | Status: DC
Start: 2021-01-30 — End: 2021-01-30

## 2021-01-30 MED ORDER — METHOCARBAMOL 500 MG PO TABS: 500.0000 mg | ORAL_TABLET | Freq: Four times a day (QID) | ORAL | Status: DC | PRN

## 2021-01-30 MED ORDER — TRAMADOL HCL 50 MG PO TABS: 50.0000 mg | ORAL_TABLET | Freq: Four times a day (QID) | ORAL | 0 refills | Status: AC | PRN
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
## 2021-01-30 NOTE — Care Management Obs Status (Signed)
MEDICARE OBSERVATION STATUS NOTIFICATION   Patient Details  Name: Janice Jones MRN: [id number] Date of Birth: 1940-12-07   Medicare Observation Status Notification Given:       Glennon Mac, RN 01/30/2021, 2:43 PM
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
## 2021-01-30 NOTE — Progress Notes (Signed)
Patient on safety/fall precautions. Bed alarm on, bed in lowest position and floormats in place. Frequently reminded to use call bell when needed assistance. Currently resting in bed comfortably. Call bell within reach and will continue to close monitor patient.
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
## 2021-01-30 NOTE — Discharge Instructions (Signed)
Right fibular neck fracture: Small bone on outside of left knee is broken. Ok to bear weight on both legs as tolerated. Follow up with Dr. Rema Fendt glass opacities in right lung on CT scan: Discuss in follow up with primary care physician. Likely will need repeat CT scan in 3-6 months   RIB FRACTURES  HOME INSTRUCTIONS   1. PAIN CONTROL:  1. Pain is best controlled by a usual combination of three different methods TOGETHER:  i. Ice/Heat ii. Over the counter pain medication iii. Prescription pain medication 2. You may experience some swelling and bruising in area of broken ribs. Ice packs or heating pads (30-60 minutes up to 6 times a day) will help. Use ice for the first few days to help decrease swelling and bruising, then switch to heat to help relax tight/sore spots and speed recovery. Some people prefer to use ice alone, heat alone, alternating between ice & heat. Experiment to what works for you. Swelling and bruising can take several weeks to resolve.  3. It is helpful to take an over-the-counter pain medication regularly for the first few weeks. Choose one of the following that works best for you:  i. Naproxen (Aleve, etc) Two 220mg  tabs twice a day ii. Ibuprofen (Advil, etc) Three 200mg  tabs four times a day (every meal & bedtime) iii. Acetaminophen (Tylenol, etc) 500-650mg  four times a day (every meal & bedtime) 4. A prescription for pain medication (such as oxycodone, hydrocodone, etc) may be given to you upon discharge. Take your pain medication as prescribed.  i. If you are having problems/concerns with the prescription medicine (does not control pain, nausea, vomiting, rash, itching, etc), please call [phone] to see if we need to switch you to a different pain medicine that will work better for you and/or control your side effect better. ii. If you need a refill on your pain medication, please contact your pharmacy. They will contact our office to request  authorization. Prescriptions will not be filled after 5 pm or on week-ends. 1. Avoid getting constipated. When taking pain medications, it is common to experience some constipation. Increasing fluid intake and taking a fiber supplement (such as Metamucil, Citrucel, FiberCon, MiraLax, etc) 1-2 times a day regularly will usually help prevent this problem from occurring. A mild laxative (prune juice, Milk of Magnesia, MiraLax, etc) should be taken according to package directions if there are no bowel movements after 48 hours.  2. Watch out for diarrhea. If you have many loose bowel movements, simplify your diet to bland foods & liquids for a few days. Stop any stool softeners and decrease your fiber supplement. Switching to mild anti-diarrheal medications (Kayopectate, Pepto Bismol) can help. If this worsens or does not improve, please call us. 3. FOLLOW UP  a. If a follow up appointment is needed one will be scheduled for you. If none is needed with our trauma team, please follow up with your primary care provider within 2-3 weeks from discharge. Please call [facility] at [phone] if you have any questions about follow up.  b. If you have any orthopedic or other injuries you will need to follow up as outlined in your follow up instructions.   WHEN TO CALL us [phone]:  1. Poor pain control 2. Reactions / problems with new medications (rash/itching, nausea, etc)  3. Fever over 101.5 F (38.5 C) 4. Worsening swelling or bruising 5. Worsening pain, productive cough, difficulty breathing or any other concerning symptoms  The clinic  staff is available to answer your questions during regular business hours (8:30am-5pm). Please don't hesitate to call and ask to speak to one of our nurses for clinical concerns.  If you have a medical emergency, go to the nearest emergency room or call 911.  A surgeon from [facility] is always on call at the [facility], [place]  [address], [address], [place], [place] [address] ?  MAIN: [phone] ? TOLL FREE: [phone] ?  FAX [phone]  [email or web address]      Information on Rib Fractures  A rib fracture is a break or crack in one of the bones of the ribs. The ribs are long, curved bones that wrap around your chest and attach to your spine and your breastbone. The ribs protect your heart, lungs, and other organs in the chest. A broken or cracked rib is often painful but is not usually serious. Most rib fractures heal on their own over time. However, rib fractures can be more serious if multiple ribs are broken or if broken ribs move out of place and push against other structures or organs. What are the causes? This condition is caused by:  Repetitive movements with high force, such as pitching a baseball or having severe coughing spells.  A direct blow to the chest, such as a sports injury, a car accident, or a fall.  Cancer that has spread to the bones, which can weaken bones and cause them to break. What are the signs or symptoms? Symptoms of this condition include:  Pain when you breathe in or cough.  Pain when someone presses on the injured area.  Feeling short of breath. How is this diagnosed? This condition is diagnosed with a physical exam and medical history. Imaging tests may also be done, such as:  Chest X-ray.  CT scan.  MRI.  Bone scan.  Chest ultrasound. How is this treated? Treatment for this condition depends on the severity of the fracture. Most rib fractures usually heal on their own in 1-3 months. Sometimes healing takes longer if there is a cough that does not stop or if there are other activities that make the injury worse (aggravating factors). While you heal, you will be given medicines to control the pain. You will also be taught deep breathing exercises. Severe injuries may require hospitalization or surgery. Follow these instructions at  home: Managing pain, stiffness, and swelling  If directed, apply ice to the injured area. ? Put ice in a plastic bag. ? Place a towel between your skin and the bag. ? Leave the ice on for 20 minutes, 2-3 times a day.  Take over-the-counter and prescription medicines only as told by your health care provider. Activity  Avoid a lot of activity and any activities or movements that cause pain. Be careful during activities and avoid bumping the injured rib.  Slowly increase your activity as told by your health care provider. General instructions  Do deep breathing exercises as told by your health care provider. This helps prevent pneumonia, which is a common complication of a broken rib. Your health care provider may instruct you to: ? Take deep breaths several times a day. ? Try to cough several times a day, holding a pillow against the injured area. ? Use a device called incentive spirometer to practice deep breathing several times a day.  Drink enough fluid to keep your urine pale yellow.  Do not wear a rib belt or binder. These  restrict breathing, which can lead to pneumonia.  Keep all follow-up visits as told by your health care provider. This is important. Contact a health care provider if:  You have a fever. Get help right away if:  You have difficulty breathing or you are short of breath.  You develop a cough that does not stop, or you cough up thick or bloody sputum.  You have nausea, vomiting, or pain in your abdomen.  Your pain gets worse and medicine does not help. Summary  A rib fracture is a break or crack in one of the bones of the ribs.  A broken or cracked rib is often painful but is not usually serious.  Most rib fractures heal on their own over time.  Treatment for this condition depends on the severity of the fracture.  Avoid a lot of activity and any activities or movements that cause pain. This information is not intended to replace advice given to  you by your health care provider. Make sure you discuss any questions you have with your health care provider. Document Released: 11/12/2005 Document Revised: 02/11/2017 Document Reviewed: 02/11/2017 Elsevier Interactive Patient Education  2019 [employer].

## 2021-01-30 NOTE — Progress Notes (Signed)
Janice Jones to be D/C'd  per MD order. Discussed with the patient and all questions fully answered.  VSS, Skin clean, dry and intact without evidence of skin break down, no evidence of skin tears noted.  IV catheter discontinued intact. Site without signs and symptoms of complications. Dressing and pressure applied.  An After Visit Summary was printed and given to the patient. Patient received prescription.  D/c education completed with patient/family including follow up instructions, medication list, d/c activities limitations if indicated, with other d/c instructions as indicated by MD - patient able to verbalize understanding, all questions fully answered.   Patient instructed to return to ED, call 911, or call MD for any changes in condition.   Patient to be escorted via WC, and D/C home via private auto.
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
## 2021-02-06 ENCOUNTER — Encounter: Admit: 2021-02-06 | Discharge: 2021-02-07 | Payer: MEDICARE | Attending: Internal Medicine | Primary: Internal Medicine

## 2021-02-06 DIAGNOSIS — F1020 Alcohol dependence, uncomplicated: Principal | ICD-10-CM

## 2021-02-06 DIAGNOSIS — I10 Essential (primary) hypertension: Principal | ICD-10-CM

## 2021-02-06 DIAGNOSIS — E876 Hypokalemia: Principal | ICD-10-CM

## 2021-02-06 DIAGNOSIS — F419 Anxiety disorder, unspecified: Principal | ICD-10-CM

## 2021-02-06 DIAGNOSIS — Z9889 Other specified postprocedural states: Principal | ICD-10-CM

## 2021-02-06 DIAGNOSIS — F0280 Dementia in other diseases classified elsewhere without behavioral disturbance: Principal | ICD-10-CM

## 2021-02-06 DIAGNOSIS — S2249XD Multiple fractures of ribs, unspecified side, subsequent encounter for fracture with routine healing: Principal | ICD-10-CM

## 2021-02-06 DIAGNOSIS — G301 Alzheimer's disease with late onset: Principal | ICD-10-CM

## 2021-02-06 DIAGNOSIS — F32A Anxiety and depression: Principal | ICD-10-CM

## 2021-02-06 DIAGNOSIS — S82831S Other fracture of upper and lower end of right fibula, sequela: Principal | ICD-10-CM

## 2021-02-06 DIAGNOSIS — S32009D Unspecified fracture of unspecified lumbar vertebra, subsequent encounter for fracture with routine healing: Principal | ICD-10-CM

## 2021-02-06 DIAGNOSIS — R918 Other nonspecific abnormal finding of lung field: Principal | ICD-10-CM

## 2021-02-06 DIAGNOSIS — F331 Major depressive disorder, recurrent, moderate: Principal | ICD-10-CM

## 2021-02-07 MED ORDER — CITALOPRAM 20 MG TABLET
ORAL_TABLET | ORAL | 3 refills | 0.00000 days | Status: CP
Start: 2021-02-07 — End: ?

## 2021-03-27 MED ORDER — OMEPRAZOLE 20 MG CAPSULE,DELAYED RELEASE
ORAL_CAPSULE | 3 refills | 0 days | Status: CP
Start: 2021-03-27 — End: ?

## 2021-03-28 MED ORDER — LOSARTAN 25 MG TABLET
ORAL_TABLET | Freq: Every day | ORAL | 3 refills | 45 days | Status: CP
Start: 2021-03-28 — End: 2021-03-28

## 2021-03-28 MED ORDER — LOSARTAN 50 MG TABLET
ORAL_TABLET | Freq: Every day | ORAL | 3 refills | 90 days | Status: CP
Start: 2021-03-28 — End: 2021-09-24

## 2021-04-17 ENCOUNTER — Encounter: Admit: 2021-04-17 | Discharge: 2021-04-18 | Payer: MEDICARE | Attending: Internal Medicine | Primary: Internal Medicine

## 2021-04-17 DIAGNOSIS — S2249XD Multiple fractures of ribs, unspecified side, subsequent encounter for fracture with routine healing: Principal | ICD-10-CM

## 2021-04-17 DIAGNOSIS — F32A Anxiety and depression: Principal | ICD-10-CM

## 2021-04-17 DIAGNOSIS — S32009D Unspecified fracture of unspecified lumbar vertebra, subsequent encounter for fracture with routine healing: Principal | ICD-10-CM

## 2021-04-17 DIAGNOSIS — F419 Anxiety disorder, unspecified: Principal | ICD-10-CM

## 2021-04-17 DIAGNOSIS — Z9889 Other specified postprocedural states: Principal | ICD-10-CM

## 2021-04-17 DIAGNOSIS — F331 Major depressive disorder, recurrent, moderate: Principal | ICD-10-CM

## 2021-04-17 DIAGNOSIS — G301 Alzheimer's disease with late onset: Principal | ICD-10-CM

## 2021-04-17 DIAGNOSIS — M810 Age-related osteoporosis without current pathological fracture: Principal | ICD-10-CM

## 2021-04-17 DIAGNOSIS — R634 Abnormal weight loss: Principal | ICD-10-CM

## 2021-04-17 DIAGNOSIS — Z9049 Acquired absence of other specified parts of digestive tract: Principal | ICD-10-CM

## 2021-04-17 DIAGNOSIS — S82831S Other fracture of upper and lower end of right fibula, sequela: Principal | ICD-10-CM

## 2021-04-17 DIAGNOSIS — F1020 Alcohol dependence, uncomplicated: Principal | ICD-10-CM

## 2021-04-17 DIAGNOSIS — F0280 Dementia in other diseases classified elsewhere without behavioral disturbance: Principal | ICD-10-CM

## 2021-04-17 DIAGNOSIS — I10 Essential (primary) hypertension: Principal | ICD-10-CM

## 2021-04-17 DIAGNOSIS — R918 Other nonspecific abnormal finding of lung field: Principal | ICD-10-CM

## 2021-04-17 MED ORDER — TRAZODONE 50 MG TABLET
ORAL_TABLET | Freq: Two times a day (BID) | ORAL | 5 refills | 60.00000 days | Status: CP | PRN
Start: 2021-04-17 — End: 2021-05-17

## 2021-05-01 MED ORDER — CITALOPRAM 20 MG TABLET
ORAL_TABLET | Freq: Every day | ORAL | 3 refills | 180 days
Start: 2021-05-01 — End: ?

## 2021-05-01 MED ORDER — LORAZEPAM 0.5 MG TABLET
ORAL_TABLET | 0 refills | 0 days | Status: CP
Start: 2021-05-01 — End: ?

## 2021-05-05 ENCOUNTER — Telehealth: Admit: 2021-05-05 | Discharge: 2021-05-06 | Payer: MEDICARE | Attending: Internal Medicine | Primary: Internal Medicine

## 2021-05-05 DIAGNOSIS — F331 Major depressive disorder, recurrent, moderate: Principal | ICD-10-CM

## 2021-05-05 DIAGNOSIS — F0280 Dementia in other diseases classified elsewhere without behavioral disturbance: Principal | ICD-10-CM

## 2021-05-05 DIAGNOSIS — I10 Essential (primary) hypertension: Principal | ICD-10-CM

## 2021-05-05 DIAGNOSIS — F32A Anxiety and depression: Principal | ICD-10-CM

## 2021-05-05 DIAGNOSIS — F419 Anxiety disorder, unspecified: Principal | ICD-10-CM

## 2021-05-05 DIAGNOSIS — G301 Alzheimer's disease with late onset: Principal | ICD-10-CM

## 2021-05-05 MED ORDER — LORAZEPAM 0.5 MG TABLET
ORAL_TABLET | ORAL | 0 refills | 0.00000 days | Status: CP
Start: 2021-05-05 — End: ?

## 2021-05-05 MED ORDER — THIAMINE HCL (VITAMIN B1) 100 MG TABLET
ORAL_TABLET | Freq: Every day | ORAL | 11 refills | 30.00000 days
Start: 2021-05-05 — End: 2022-05-05

## 2021-05-05 MED ORDER — SERTRALINE 50 MG TABLET
ORAL_TABLET | Freq: Every day | ORAL | 5 refills | 30 days | Status: CP
Start: 2021-05-05 — End: 2022-05-05

## 2021-05-18 MED ORDER — SERTRALINE 50 MG TABLET
ORAL_TABLET | Freq: Every day | ORAL | 3 refills | 60 days
Start: 2021-05-18 — End: 2022-05-18

## 2021-05-24 MED ORDER — SERTRALINE 50 MG TABLET
ORAL_TABLET | Freq: Every day | ORAL | 3 refills | 60 days | Status: CP
Start: 2021-05-24 — End: 2021-07-23

## 2021-07-04 ENCOUNTER — Encounter: Admit: 2021-07-04 | Discharge: 2021-07-05 | Payer: MEDICARE | Attending: Psychiatry | Primary: Psychiatry

## 2021-07-04 ENCOUNTER — Encounter: Admit: 2021-07-04 | Discharge: 2021-07-05 | Payer: MEDICARE | Attending: Internal Medicine | Primary: Internal Medicine

## 2021-07-04 DIAGNOSIS — F32A Anxiety and depression: Principal | ICD-10-CM

## 2021-07-04 DIAGNOSIS — M810 Age-related osteoporosis without current pathological fracture: Principal | ICD-10-CM

## 2021-07-04 DIAGNOSIS — Z9889 Other specified postprocedural states: Principal | ICD-10-CM

## 2021-07-04 DIAGNOSIS — R5383 Other fatigue: Principal | ICD-10-CM

## 2021-07-04 DIAGNOSIS — G301 Alzheimer's disease with late onset: Principal | ICD-10-CM

## 2021-07-04 DIAGNOSIS — F1020 Alcohol dependence, uncomplicated: Principal | ICD-10-CM

## 2021-07-04 DIAGNOSIS — N289 Disorder of kidney and ureter, unspecified: Principal | ICD-10-CM

## 2021-07-04 DIAGNOSIS — E611 Iron deficiency: Principal | ICD-10-CM

## 2021-07-04 DIAGNOSIS — F419 Anxiety disorder, unspecified: Principal | ICD-10-CM

## 2021-07-04 DIAGNOSIS — E559 Vitamin D deficiency, unspecified: Principal | ICD-10-CM

## 2021-07-04 DIAGNOSIS — F0280 Dementia in other diseases classified elsewhere without behavioral disturbance: Principal | ICD-10-CM

## 2021-07-04 DIAGNOSIS — F331 Major depressive disorder, recurrent, moderate: Principal | ICD-10-CM

## 2021-07-04 DIAGNOSIS — I10 Essential (primary) hypertension: Principal | ICD-10-CM

## 2021-07-04 DIAGNOSIS — F411 Generalized anxiety disorder: Principal | ICD-10-CM

## 2021-07-04 DIAGNOSIS — R918 Other nonspecific abnormal finding of lung field: Principal | ICD-10-CM

## 2021-07-04 MED ORDER — CYANOCOBALAMIN (VIT B-12) 1,000 MCG TABLET
ORAL_TABLET | Freq: Every day | ORAL | 11 refills | 30 days
Start: 2021-07-04 — End: 2022-07-04

## 2021-07-24 MED ORDER — LORAZEPAM 0.5 MG TABLET
ORAL_TABLET | 0 refills | 0 days | Status: CP
Start: 2021-07-24 — End: ?

## 2021-07-25 MED ORDER — LORAZEPAM 0.5 MG TABLET
ORAL_TABLET | 0 refills | 0 days | Status: CP
Start: 2021-07-25 — End: ?

## 2021-08-17 MED ORDER — SERTRALINE 100 MG TABLET
ORAL_TABLET | Freq: Every day | ORAL | 3 refills | 90.00000 days | Status: CP
Start: 2021-08-17 — End: 2021-11-15

## 2021-08-24 MED ORDER — MIRTAZAPINE 30 MG TABLET
ORAL_TABLET | 3 refills | 0 days | Status: CP
Start: 2021-08-24 — End: ?

## 2021-09-19 MED ORDER — SERTRALINE 50 MG TABLET
ORAL_TABLET | Freq: Every day | ORAL | 3 refills | 90 days | Status: CP
Start: 2021-09-19 — End: 2021-12-18

## 2021-10-17 ENCOUNTER — Ambulatory Visit: Admit: 2021-10-17 | Discharge: 2021-10-18 | Payer: MEDICARE | Attending: Internal Medicine | Primary: Internal Medicine

## 2021-10-17 DIAGNOSIS — F411 Generalized anxiety disorder: Principal | ICD-10-CM

## 2021-10-17 DIAGNOSIS — F410 Panic disorder [episodic paroxysmal anxiety] without agoraphobia: Principal | ICD-10-CM

## 2021-10-17 DIAGNOSIS — R918 Other nonspecific abnormal finding of lung field: Principal | ICD-10-CM

## 2021-10-17 DIAGNOSIS — G301 Alzheimer's disease with late onset: Principal | ICD-10-CM

## 2021-10-17 DIAGNOSIS — F1029 Alcohol dependence with unspecified alcohol-induced disorder: Principal | ICD-10-CM

## 2021-10-17 DIAGNOSIS — F02B4 Moderate late onset Alzheimer's dementia with anxiety (CMS-HCC): Principal | ICD-10-CM

## 2021-10-17 DIAGNOSIS — M810 Age-related osteoporosis without current pathological fracture: Principal | ICD-10-CM

## 2021-10-17 DIAGNOSIS — I10 Essential (primary) hypertension: Principal | ICD-10-CM

## 2021-10-17 MED ORDER — DONEPEZIL 5 MG TABLET
ORAL_TABLET | Freq: Every evening | ORAL | 2 refills | 30.00000 days | Status: CP
Start: 2021-10-17 — End: 2022-10-17

## 2022-04-02 MED ORDER — LOSARTAN 50 MG TABLET
ORAL_TABLET | 3 refills | 0 days | Status: CP
Start: 2022-04-02 — End: ?

## 2022-04-12 DIAGNOSIS — M810 Age-related osteoporosis without current pathological fracture: Principal | ICD-10-CM

## 2022-04-17 ENCOUNTER — Ambulatory Visit: Admit: 2022-04-17 | Discharge: 2022-04-18 | Payer: MEDICARE | Attending: Internal Medicine | Primary: Internal Medicine

## 2022-04-17 DIAGNOSIS — F410 Panic disorder [episodic paroxysmal anxiety] without agoraphobia: Principal | ICD-10-CM

## 2022-04-17 DIAGNOSIS — F1029 Alcohol dependence with unspecified alcohol-induced disorder: Principal | ICD-10-CM

## 2022-04-17 DIAGNOSIS — F411 Generalized anxiety disorder: Principal | ICD-10-CM

## 2022-04-17 DIAGNOSIS — I10 Essential (primary) hypertension: Principal | ICD-10-CM

## 2022-04-17 DIAGNOSIS — F331 Major depressive disorder, recurrent, moderate: Principal | ICD-10-CM

## 2022-04-17 DIAGNOSIS — M810 Age-related osteoporosis without current pathological fracture: Principal | ICD-10-CM

## 2022-04-17 DIAGNOSIS — G301 Alzheimer's disease with late onset: Principal | ICD-10-CM

## 2022-04-17 DIAGNOSIS — F02B4 Moderate late onset Alzheimer's dementia with anxiety (CMS-HCC): Principal | ICD-10-CM

## 2022-04-17 DIAGNOSIS — Z5181 Encounter for therapeutic drug level monitoring: Principal | ICD-10-CM

## 2022-06-12 MED ORDER — OMEPRAZOLE 20 MG CAPSULE,DELAYED RELEASE
ORAL_CAPSULE | Freq: Every day | ORAL | 3 refills | 90 days | Status: CP
Start: 2022-06-12 — End: ?

## 2022-07-05 MED ORDER — MIRTAZAPINE 30 MG TABLET
ORAL_TABLET | Freq: Every evening | ORAL | 3 refills | 90 days | Status: CP
Start: 2022-07-05 — End: ?
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
## 2022-09-09 DIAGNOSIS — M810 Age-related osteoporosis without current pathological fracture: Principal | ICD-10-CM

## 2022-09-09 MED ORDER — PROLIA 60 MG/ML SUBCUTANEOUS SYRINGE
SUBCUTANEOUS | 1 refills | 180 days | Status: CP
Start: 2022-09-09 — End: ?

## 2022-09-11 DIAGNOSIS — M810 Age-related osteoporosis without current pathological fracture: Principal | ICD-10-CM

## 2022-09-12 NOTE — Unmapped (Signed)
[facility] Specialty Medication Onboarding    Specialty Medication: Prolia  Prior Authorization: Not Required   Financial Assistance: No - copay card or gant not available   Final Copay/Day Supply: $300 / 180    Insurance Restrictions: None     Notes to Pharmacist:     The triage team has completed the benefits investigation and has determined that the patient is able to fill this medication at [facility]. Please contact the patient to complete the onboarding or follow up with the prescribing physician as needed.

## 2022-09-13 NOTE — Unmapped (Signed)
Specialty Medication(s): Prolia    Ms.Jacob has been dis-enrolled from the [facility] specialty pharmacy services due to  cost - clinic will continue to buy and bill per previous workflow .    Additional information provided to the patient: n/a    Arnold Long, PharmD  [facility] Specialty Pharmacist

## 2022-09-27 MED ORDER — SERTRALINE 100 MG TABLET
ORAL_TABLET | Freq: Every day | ORAL | 3 refills | 90 days | Status: CP
Start: 2022-09-27 — End: 2022-12-26

## 2022-10-30 MED ORDER — PROLIA 60 MG/ML SUBCUTANEOUS SYRINGE
SUBCUTANEOUS | 1 refills | 180 days | Status: CP
Start: 2022-10-30 — End: ?

## 2023-01-03 MED ORDER — SERTRALINE 50 MG TABLET
ORAL_TABLET | Freq: Every day | ORAL | 11 refills | 30 days | Status: CP
Start: 2023-01-03 — End: 2023-02-02

## 2023-02-18 ENCOUNTER — Ambulatory Visit: Admit: 2023-02-18 | Discharge: 2023-02-19 | Payer: MEDICARE | Attending: Internal Medicine | Primary: Internal Medicine

## 2023-02-18 DIAGNOSIS — F02B3 Moderate dementia associated with other underlying disease, with mood disturbance (CMS-HCC): Principal | ICD-10-CM

## 2023-02-18 DIAGNOSIS — M810 Age-related osteoporosis without current pathological fracture: Principal | ICD-10-CM

## 2023-02-18 DIAGNOSIS — I10 Essential (primary) hypertension: Principal | ICD-10-CM

## 2023-02-18 DIAGNOSIS — F419 Anxiety disorder, unspecified: Principal | ICD-10-CM

## 2023-02-18 DIAGNOSIS — F411 Generalized anxiety disorder: Principal | ICD-10-CM

## 2023-02-18 DIAGNOSIS — Z0000 Encounter for general adult medical examination without abnormal findings: Principal | ICD-10-CM

## 2023-02-18 DIAGNOSIS — F1029 Alcohol dependence with unspecified alcohol-induced disorder: Principal | ICD-10-CM

## 2023-02-18 DIAGNOSIS — F32A Anxiety and depression: Principal | ICD-10-CM

## 2023-02-18 DIAGNOSIS — E559 Vitamin D deficiency, unspecified: Principal | ICD-10-CM

## 2023-02-18 DIAGNOSIS — F410 Panic disorder [episodic paroxysmal anxiety] without agoraphobia: Principal | ICD-10-CM

## 2023-02-18 DIAGNOSIS — F331 Major depressive disorder, recurrent, moderate: Principal | ICD-10-CM

## 2023-02-18 MED ORDER — SERTRALINE 50 MG TABLET
ORAL_TABLET | Freq: Every day | ORAL | 3 refills | 90 days | Status: CP
Start: 2023-02-18 — End: 2024-02-13

## 2023-03-04 ENCOUNTER — Other Ambulatory Visit: Payer: Self-pay | Admitting: Student

## 2023-03-04 DIAGNOSIS — M810 Age-related osteoporosis without current pathological fracture: Secondary | ICD-10-CM

## 2023-03-05 DIAGNOSIS — M810 Age-related osteoporosis without current pathological fracture: Principal | ICD-10-CM

## 2023-03-25 MED ORDER — LOSARTAN 50 MG TABLET
ORAL_TABLET | 3 refills | 0 days
Start: 2023-03-25 — End: ?

## 2023-03-26 MED ORDER — LOSARTAN 50 MG TABLET
ORAL_TABLET | Freq: Every day | ORAL | 3 refills | 90 days | Status: CP
Start: 2023-03-26 — End: ?

## 2023-03-28 ENCOUNTER — Ambulatory Visit: Admit: 2023-03-28 | Discharge: 2023-03-29 | Payer: MEDICARE

## 2023-04-02 DIAGNOSIS — M810 Age-related osteoporosis without current pathological fracture: Principal | ICD-10-CM

## 2023-05-03 MED ORDER — ALENDRONATE 70 MG TABLET
ORAL_TABLET | ORAL | 3 refills | 84 days | Status: CP
Start: 2023-05-03 — End: 2024-05-02

## 2023-06-12 MED ORDER — OMEPRAZOLE 20 MG CAPSULE,DELAYED RELEASE
ORAL_CAPSULE | Freq: Every day | ORAL | 3 refills | 90 days
Start: 2023-06-12 — End: ?

## 2023-06-13 MED ORDER — OMEPRAZOLE 20 MG CAPSULE,DELAYED RELEASE
ORAL_CAPSULE | Freq: Every day | ORAL | 3 refills | 90 days | Status: CP
Start: 2023-06-13 — End: ?

## 2023-07-17 MED ORDER — MIRTAZAPINE 30 MG TABLET
ORAL_TABLET | Freq: Every evening | ORAL | 3 refills | 90 days | Status: CP
Start: 2023-07-17 — End: ?

## 2023-08-26 ENCOUNTER — Ambulatory Visit: Admit: 2023-08-26 | Discharge: 2023-08-27 | Payer: MEDICARE | Attending: Internal Medicine | Primary: Internal Medicine

## 2023-08-26 DIAGNOSIS — F03B Moderate dementia, unspecified dementia type, unspecified whether behavioral, psychotic, or mood disturbance or anxiety (CMS-HCC): Principal | ICD-10-CM

## 2023-08-26 DIAGNOSIS — Z23 Encounter for immunization: Principal | ICD-10-CM

## 2023-08-26 DIAGNOSIS — M810 Age-related osteoporosis without current pathological fracture: Principal | ICD-10-CM

## 2023-08-26 DIAGNOSIS — F411 Generalized anxiety disorder: Principal | ICD-10-CM

## 2023-08-26 DIAGNOSIS — F331 Major depressive disorder, recurrent, moderate: Principal | ICD-10-CM

## 2023-08-26 DIAGNOSIS — Z9049 Acquired absence of other specified parts of digestive tract: Principal | ICD-10-CM

## 2023-08-26 DIAGNOSIS — I10 Essential (primary) hypertension: Principal | ICD-10-CM

## 2023-08-26 DIAGNOSIS — Z5181 Encounter for therapeutic drug level monitoring: Principal | ICD-10-CM

## 2023-09-09 ENCOUNTER — Other Ambulatory Visit: Payer: Medicare Other

## 2024-02-11 ENCOUNTER — Ambulatory Visit: Admit: 2024-02-11 | Discharge: 2024-02-12 | Payer: MEDICARE | Attending: Internal Medicine | Primary: Internal Medicine

## 2024-02-11 DIAGNOSIS — F32A Anxiety and depression: Principal | ICD-10-CM

## 2024-02-11 DIAGNOSIS — I10 Essential (primary) hypertension: Principal | ICD-10-CM

## 2024-02-11 DIAGNOSIS — F02B4 Moderate late onset Alzheimer's dementia with anxiety (CMS-HCC): Principal | ICD-10-CM

## 2024-02-11 DIAGNOSIS — F419 Anxiety disorder, unspecified: Principal | ICD-10-CM

## 2024-02-11 DIAGNOSIS — R2681 Unsteadiness on feet: Principal | ICD-10-CM

## 2024-02-11 DIAGNOSIS — E785 Hyperlipidemia, unspecified: Principal | ICD-10-CM

## 2024-02-11 DIAGNOSIS — L989 Disorder of the skin and subcutaneous tissue, unspecified: Principal | ICD-10-CM

## 2024-02-11 DIAGNOSIS — G301 Alzheimer's disease with late onset: Principal | ICD-10-CM

## 2024-02-11 DIAGNOSIS — M810 Age-related osteoporosis without current pathological fracture: Principal | ICD-10-CM

## 2024-02-11 DIAGNOSIS — Z23 Encounter for immunization: Principal | ICD-10-CM

## 2024-02-19 MED ORDER — SERTRALINE 50 MG TABLET
ORAL_TABLET | Freq: Every day | ORAL | 3 refills | 90 days | Status: CP
Start: 2024-02-19 — End: 2025-02-13

## 2024-02-19 MED ORDER — LOSARTAN 50 MG TABLET
ORAL_TABLET | Freq: Every day | ORAL | 3 refills | 90 days | Status: CP
Start: 2024-02-19 — End: ?

## 2024-04-01 DIAGNOSIS — M810 Age-related osteoporosis without current pathological fracture: Principal | ICD-10-CM

## 2024-04-01 MED ORDER — ALENDRONATE 70 MG TABLET
ORAL_TABLET | ORAL | 3 refills | 84.00000 days | Status: CP
Start: 2024-04-01 — End: 2025-04-01

## 2024-05-18 MED ORDER — OMEPRAZOLE 20 MG CAPSULE,DELAYED RELEASE
ORAL_CAPSULE | Freq: Every day | ORAL | 3 refills | 90.00000 days | Status: CP
Start: 2024-05-18 — End: ?

## 2024-07-06 MED ORDER — MIRTAZAPINE 30 MG TABLET
ORAL_TABLET | Freq: Every evening | ORAL | 3 refills | 90.00000 days
Start: 2024-07-06 — End: ?

## 2024-07-07 MED ORDER — MIRTAZAPINE 30 MG TABLET
ORAL_TABLET | Freq: Every evening | ORAL | 3 refills | 90.00000 days | Status: CP
Start: 2024-07-07 — End: ?

## 2024-08-17 ENCOUNTER — Ambulatory Visit
Admit: 2024-08-17 | Discharge: 2024-08-18 | Payer: Medicare (Managed Care) | Attending: Internal Medicine | Primary: Internal Medicine

## 2024-08-17 DIAGNOSIS — F330 Major depressive disorder, recurrent, mild: Principal | ICD-10-CM

## 2024-08-17 DIAGNOSIS — F419 Anxiety disorder, unspecified: Principal | ICD-10-CM

## 2024-08-17 DIAGNOSIS — Z0000 Encounter for general adult medical examination without abnormal findings: Principal | ICD-10-CM

## 2024-08-17 DIAGNOSIS — G301 Alzheimer's disease with late onset: Principal | ICD-10-CM

## 2024-08-17 DIAGNOSIS — I10 Essential (primary) hypertension: Principal | ICD-10-CM

## 2024-08-17 DIAGNOSIS — F411 Generalized anxiety disorder: Principal | ICD-10-CM

## 2024-08-17 DIAGNOSIS — M810 Age-related osteoporosis without current pathological fracture: Principal | ICD-10-CM

## 2024-08-17 DIAGNOSIS — R2681 Unsteadiness on feet: Principal | ICD-10-CM

## 2024-08-17 DIAGNOSIS — F32A Anxiety and depression: Principal | ICD-10-CM

## 2024-08-17 DIAGNOSIS — F02B4 Moderate late onset Alzheimer's dementia with anxiety (CMS-HCC): Principal | ICD-10-CM

## 2024-08-17 DIAGNOSIS — Z9049 Acquired absence of other specified parts of digestive tract: Principal | ICD-10-CM
